# Patient Record
Sex: Male | Born: 1940 | Race: White | Hispanic: No | Marital: Married | Smoking: Never smoker
Health system: Southern US, Community
[De-identification: ages and names within clinical notes are randomized; demographics above are authoritative.]

## PROBLEM LIST (undated history)

## (undated) DIAGNOSIS — T7840XA Allergy, unspecified, initial encounter: Secondary | ICD-10-CM

## (undated) DIAGNOSIS — M1990 Unspecified osteoarthritis, unspecified site: Secondary | ICD-10-CM

## (undated) DIAGNOSIS — IMO0002 Reserved for concepts with insufficient information to code with codable children: Secondary | ICD-10-CM

## (undated) HISTORY — DX: Allergy, unspecified, initial encounter: T78.40XA

## (undated) HISTORY — PX: HERNIA REPAIR: SHX51

## (undated) HISTORY — PX: SHOULDER SURGERY: SHX246

## (undated) HISTORY — PX: JOINT REPLACEMENT: SHX530

## (undated) HISTORY — PX: INGUINAL HERNIA REPAIR: SHX194

## (undated) HISTORY — PX: PROSTATE SURGERY: SHX751

## (undated) HISTORY — DX: Unspecified osteoarthritis, unspecified site: M19.90

## (undated) HISTORY — PX: SQUAMOUS CELL CARCINOMA EXCISION: SHX2433

---

## 2013-08-28 ENCOUNTER — Emergency Department
Admission: EM | Admit: 2013-08-28 | Discharge: 2013-08-28 | Disposition: A | Discharge status: Home / Self Care | Payer: Medicare Other | Source: Home / Self Care | Attending: Family Medicine | Admitting: Family Medicine

## 2013-08-28 ENCOUNTER — Emergency Department (INDEPENDENT_AMBULATORY_CARE_PROVIDER_SITE_OTHER): Payer: Medicare Other

## 2013-08-28 ENCOUNTER — Encounter: Payer: Self-pay | Admitting: Emergency Medicine

## 2013-08-28 DIAGNOSIS — S0190XA Unspecified open wound of unspecified part of head, initial encounter: Secondary | ICD-10-CM

## 2013-08-28 DIAGNOSIS — W219XXA Striking against or struck by unspecified sports equipment, initial encounter: Secondary | ICD-10-CM

## 2013-08-28 DIAGNOSIS — S0100XA Unspecified open wound of scalp, initial encounter: Secondary | ICD-10-CM

## 2013-08-28 DIAGNOSIS — S0101XA Laceration without foreign body of scalp, initial encounter: Secondary | ICD-10-CM

## 2013-08-28 DIAGNOSIS — S0990XA Unspecified injury of head, initial encounter: Secondary | ICD-10-CM

## 2013-08-28 HISTORY — DX: Reserved for concepts with insufficient information to code with codable children: IMO0002

## 2013-08-28 NOTE — ED Provider Notes (Signed)
CSN: [id number]     Arrival date & time 08/28/13  1816 History   First MD Initiated Contact with Patient 08/28/13 1824     Chief Complaint  Patient presents with  . Head Laceration     HPI Comments: [place]  While sitting on his porch this evening, a golf ball suddenly hit the left superior aspect of his head, resulting in a laceration and hematoma.  He had no loss of consciousness, and no localizing neurologic symptoms have developed.  He denies headache.  No nausea/vomiting.  His wife states that his behavior has been normal.  His last Tdap was in 2012.  Patient is a 72 y.o. male presenting with scalp laceration. The history is provided by the patient and the spouse.  Head Laceration This is a new problem. The current episode started less than 1 hour ago. The problem has been gradually improving. Pertinent negatives include no headaches. Associated symptoms comments: No localizing neurologic symptoms.. Treatments tried: pressure dressing.    Past Medical History  Diagnosis Date  . Squamous cell carcinoma    Past Surgical History  Procedure Laterality Date  . Shoulder surgery Right   . Squamous cell carcinoma excision     History reviewed. No pertinent family history. History  Substance Use Topics  . Smoking status: Never Smoker   . Smokeless tobacco: Not on file  . Alcohol Use: Yes    Review of Systems  Constitutional: Negative.   HENT: Negative for ear pain, facial swelling, hearing loss and trouble swallowing.   Eyes: Negative for visual disturbance.  Respiratory: Negative.   Cardiovascular: Negative.   Gastrointestinal: Negative for nausea and vomiting.  Genitourinary: Negative.   Neurological: Negative for dizziness, seizures, syncope, facial asymmetry, speech difficulty, weakness, numbness and headaches.    Allergies  Review of patient's allergies indicates no known allergies.  Home Medications   Current Outpatient Rx  Name  Route   Sig  Dispense  Refill  . bimatoprost (LUMIGAN) 0.03 % ophthalmic solution      1 drop at bedtime.          BP 142/93  Pulse 66  Temp(Src) 97.5 F (36.4 C) (Oral)  Resp 16  SpO2 97% Physical Exam  Nursing note and vitals reviewed. Constitutional: He is oriented to person, place, and time. He appears well-developed and well-nourished. No distress.  HENT:  Head: Normocephalic. Head is with laceration. Head is without Battle's sign.    Right Ear: External ear normal.  Left Ear: External ear normal.  Nose: Nose normal.  Mouth/Throat: Oropharynx is clear and moist.  On the left frontal area, anterior to coronal suture area is a 3cm dia hematoma with a central superficial T-shaped laceration approximately 3cm in total length.  There is minimal tenderness to palpation, and no evidence of depressed skull fracture.  Eyes: Conjunctivae and EOM are normal. Pupils are equal, round, and reactive to light.  Neck: Normal range of motion.  Cardiovascular: Normal heart sounds.   Pulmonary/Chest: Breath sounds normal.  Neurological: He is alert and oriented to person, place, and time. He has normal reflexes. No cranial nerve deficit or sensory deficit. He exhibits normal muscle tone. Coordination normal.  Cerebellar function intact (finger to nose, rapid alternating movement)  Skin: Skin is warm and dry.  Psychiatric: He has a normal mood and affect. Thought content normal.    ED Course  Procedures  Laceration Repair Discussed benefits and risks of procedure and verbal consent obtained.  Using sterile technique, cleansed wound with normal saline.  Wound carefully inspected for debris and foreign bodies; none found.  Wound closed with four staples.  Bacitracin and sterile dressing applied.  Wound precautions explained to patient.  Return for staple removal in 10 days.     Imaging Review Dg Skull Complete  08/28/2013   CLINICAL DATA:  Hit by a golf ball. Laceration  EXAM: SKULL - COMPLETE 4 +  VIEW  COMPARISON:  None.  FINDINGS: There is no evidence of skull fracture or other focal bone lesions.  IMPRESSION: Negative.   Electronically Signed   By: Marlan Palau M.D.   On: 08/28/2013 19:17      MDM   1. Laceration of scalp, initial encounter   2. Head injury, acute, initial encounter; normal neurologic exam     Keep wound clean and dry.  Change bandage daily, and apply antibiotic ointment.  May take Tylenol for pain.  Return for any signs of infection:  Increased drainage, redness, pain, etc.  Discussed head injury precautions.   Apply ice pack every 1 to 2 hours until swelling decreases. Staple removal in 10 days. If symptoms become significantly worse during the night or over the weekend, proceed to the local emergency room.      Lattie Haw, MD 08/28/13 [phone]

## 2013-08-28 NOTE — ED Notes (Signed)
Pt c/o laceration to the LT side of his scalp x 30 mins ago. He reports that he was hit in the head with a golf ball. Denies LOC.

## 2014-08-05 IMAGING — CR DG SKULL COMPLETE 4+V
4 series · 4 of 4 positions shown · non-contrast
Comparison: None.

CLINICAL DATA: Hit by a golf ball. Laceration

EXAM:
SKULL - COMPLETE 4 + VIEW

[view not recorded (1 of 4)]
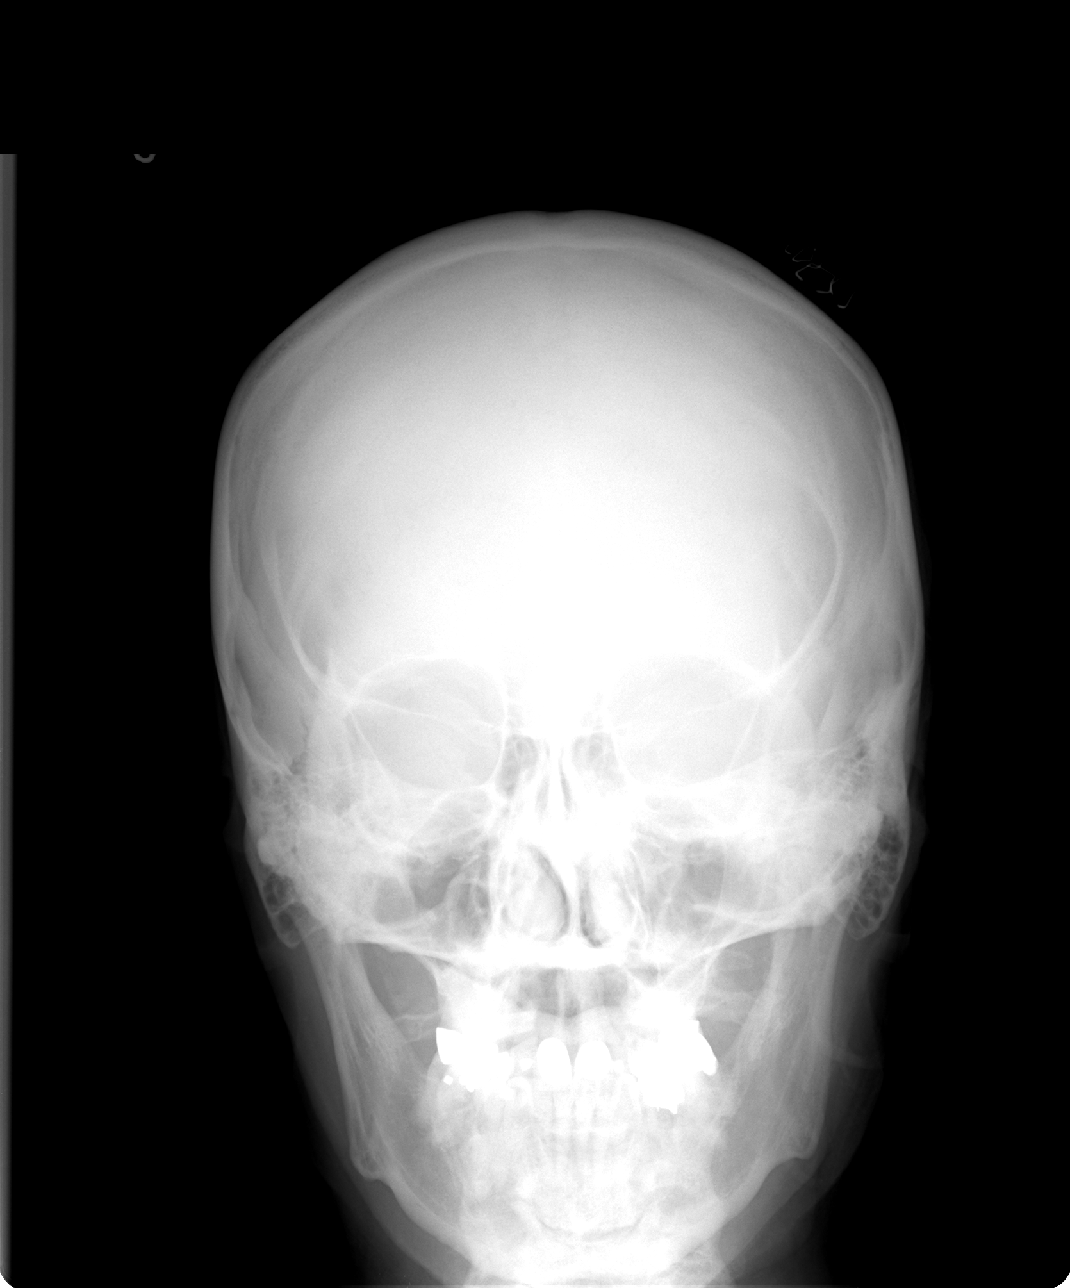

[view not recorded (2 of 4)]
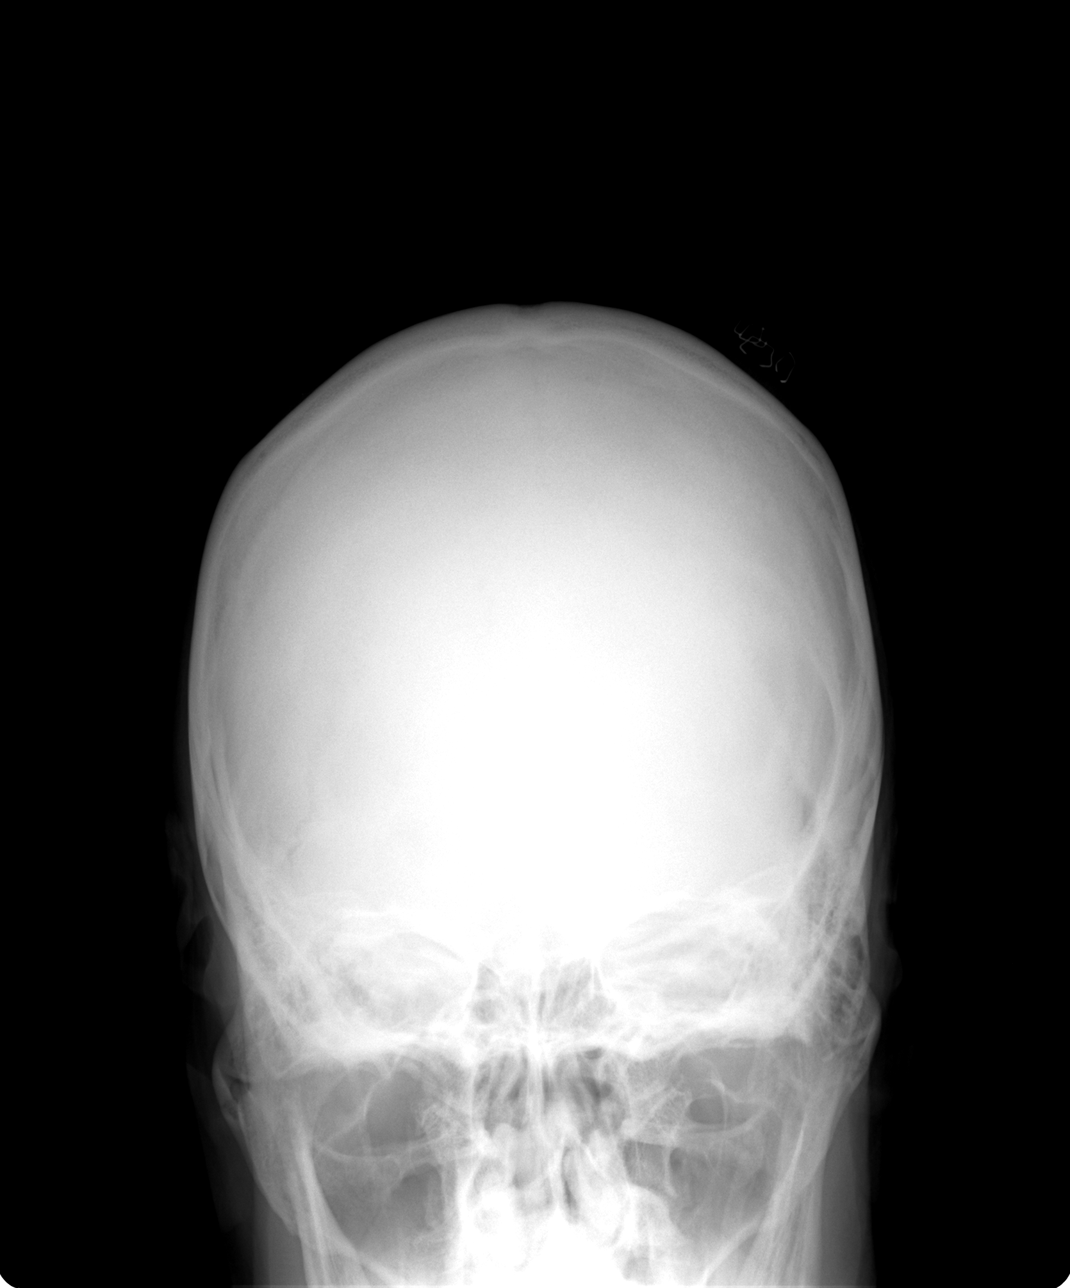

[view not recorded (3 of 4)]
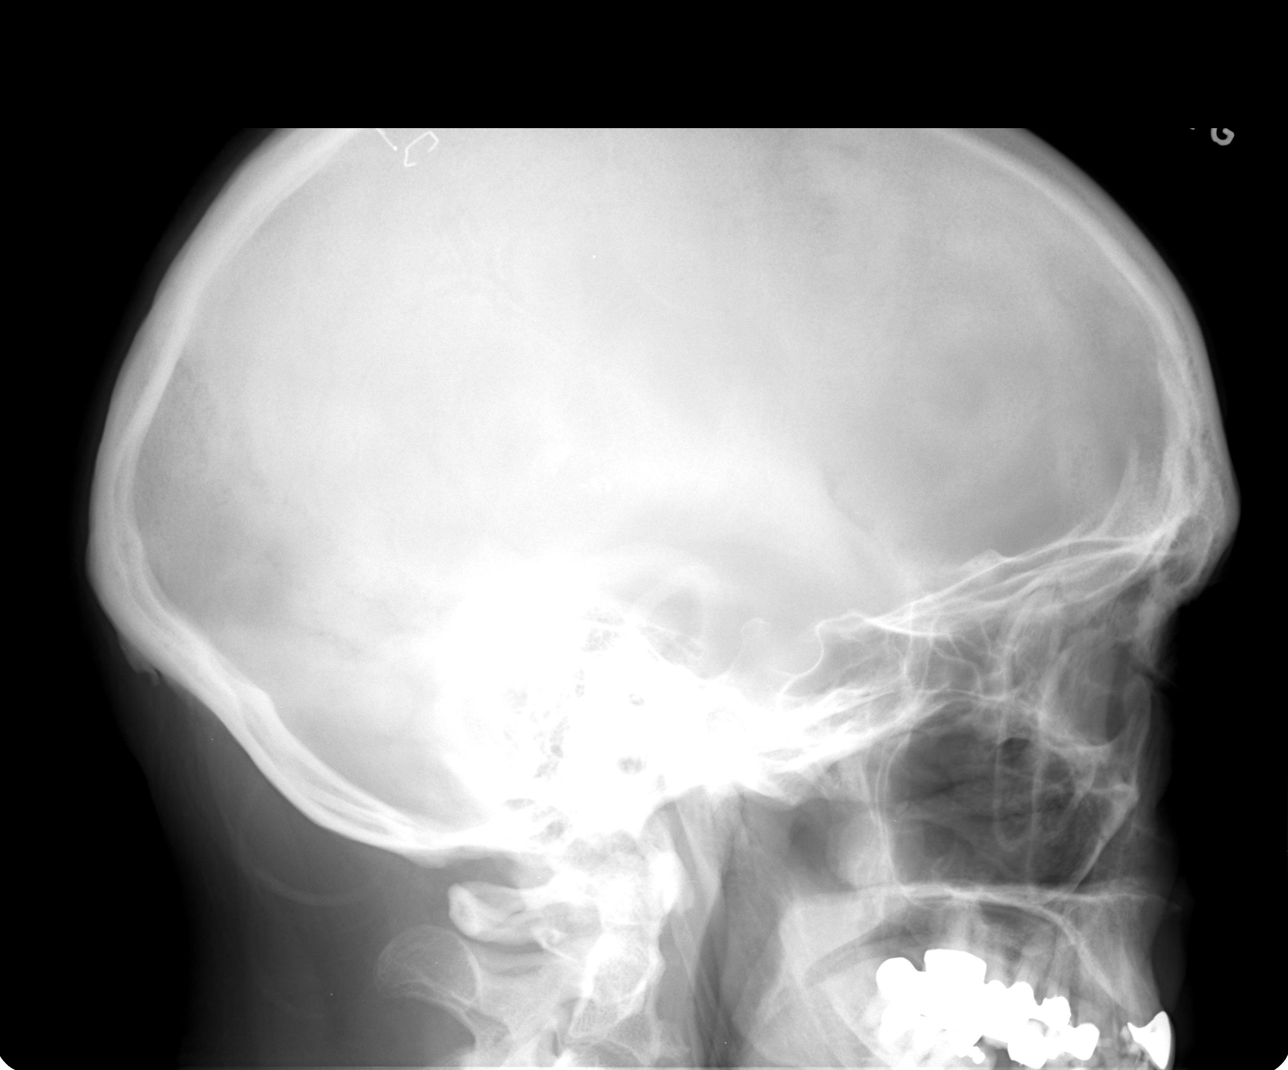

[view not recorded (4 of 4)]
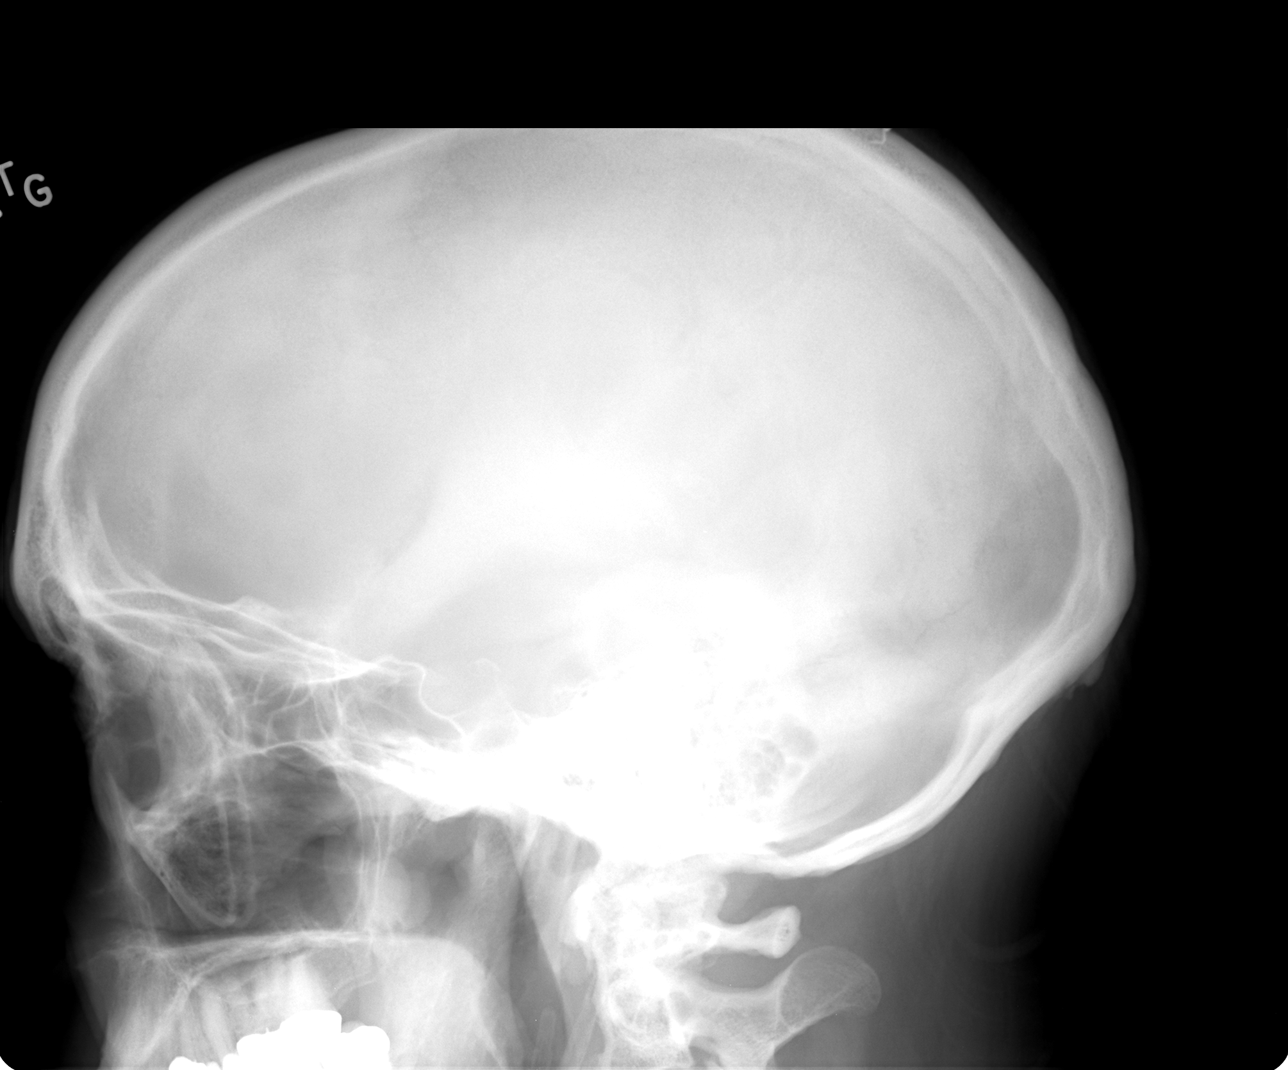

[4 of 4 positions shown; findings below may reference images not displayed]

FINDINGS: There is no evidence of skull fracture or other focal bone lesions.
IMPRESSION: Negative.

## 2022-09-14 DIAGNOSIS — C44319 Basal cell carcinoma of skin of other parts of face: Secondary | ICD-10-CM | POA: Diagnosis not present

## 2022-10-15 ENCOUNTER — Encounter: Payer: Self-pay | Admitting: Medical

## 2022-10-15 ENCOUNTER — Ambulatory Visit (INDEPENDENT_AMBULATORY_CARE_PROVIDER_SITE_OTHER): Payer: Medicare Other | Admitting: Medical

## 2022-10-15 VITALS — BP 124/76 | HR 54 | Temp 98.0°F | Resp 18 | Ht 68.0 in | Wt 154.0 lb

## 2022-10-15 DIAGNOSIS — E785 Hyperlipidemia, unspecified: Secondary | ICD-10-CM | POA: Diagnosis not present

## 2022-10-15 DIAGNOSIS — Z8719 Personal history of other diseases of the digestive system: Secondary | ICD-10-CM

## 2022-10-15 DIAGNOSIS — Z9889 Other specified postprocedural states: Secondary | ICD-10-CM

## 2022-10-15 DIAGNOSIS — R1030 Lower abdominal pain, unspecified: Secondary | ICD-10-CM

## 2022-10-15 LAB — LIPID PANEL
Cholesterol: 187 mg/dL (ref 0–200)
HDL: 49.4 mg/dL (ref >39.00)
LDL Cholesterol: 110 mg/dL — ABNORMAL HIGH (ref 0–99)
NonHDL: 137.56
Total CHOL/HDL Ratio: 4
Triglycerides: 136 mg/dL (ref 0.0–149.0)
VLDL: 27.2 mg/dL (ref 0.0–40.0)

## 2022-10-15 LAB — COMPREHENSIVE METABOLIC PANEL
ALT: 19 U/L (ref 0–53)
AST: 25 U/L (ref 0–37)
Albumin: 4.4 g/dL (ref 3.5–5.2)
Alkaline Phosphatase: 59 U/L (ref 39–117)
BUN: 13 mg/dL (ref 6–23)
CO2: 29 mEq/L (ref 19–32)
Calcium: 9.2 mg/dL (ref 8.4–10.5)
Chloride: 106 mEq/L (ref 96–112)
Creatinine, Ser: 0.86 mg/dL (ref 0.40–1.50)
GFR: 82.12 mL/min (ref >60.00)
Glucose, Bld: 100 mg/dL — ABNORMAL HIGH (ref 70–99)
Potassium: 4.5 mEq/L (ref 3.5–5.1)
Sodium: 142 mEq/L (ref 135–145)
Total Bilirubin: 0.7 mg/dL (ref 0.2–1.2)
Total Protein: 6.9 g/dL (ref 6.0–8.3)

## 2022-10-15 NOTE — Patient Instructions (Addendum)
Nice to meet you.  For you high cholesterol history will get cmp and lipid panel today.  For hx of rt inguinal hernia and surgery I could not feel hernia. Offered surgeon referral but declined. If more pain or bulge let me know and will refer.   Vaccines done at [place]. Though pt states did not get pneumonia, flu or shingles.  Follow up date to be determined after lab review.

## 2022-10-15 NOTE — Progress Notes (Signed)
Subjective:    Patient ID: Larry Fuller, male    DOB: 1941/06/13, 81 y.o.   MRN: [id number]  HPI  Pt in for follow up.  Pt former Cabin crew for 4 years. Pt graduated from [employer] [place] [employer]. Sold optical equipment and other business. Some pharmaceutical studies.  Pt exercises 3-4 days a week. Former did Customer service manager. Eats healthy recently. Non smoker. No alcohol.   [facility] He states just started going.   Prostate cancer hx. He had prostatectomy in 2005 Pt has dermatolgoist and podiatrist already.  Pt has high cholesterol- states has had in past but will come down with strict diet. Pt never been on statin.  Pt has borderline bp level. Pt checks his bp at home and in past his bp was well controlled. This am 124/76  Pt states April 13, 2022. He had inguinial hernia repair. He has minimal pain at times. No bulge. Pt had it done in [place].   Review of Systems  Constitutional:  Negative for chills, fatigue and fever.  HENT:  Negative for dental problem and ear discharge.   Respiratory:  Negative for cough, chest tightness, shortness of breath and wheezing.   Cardiovascular:  Negative for chest pain and palpitations.  Gastrointestinal:  Negative for abdominal pain and anal bleeding.  Genitourinary:  Negative for dysuria, flank pain, frequency and hematuria.  Musculoskeletal:  Negative for back pain, joint swelling, myalgias and neck pain.  Neurological:  Negative for dizziness, speech difficulty, weakness, light-headedness and headaches.  Hematological:  Negative for adenopathy. Does not bruise/bleed easily.  Psychiatric/Behavioral:  Negative for behavioral problems and confusion.      Past Medical History:  Diagnosis Date   Squamous cell carcinoma      Social History   Socioeconomic History   Marital status: Married    Spouse name: Not on file   Number of children: Not on file   Years of education: Not on file   Highest education level: Not on  file  Occupational History   Not on file  Tobacco Use   Smoking status: Never   Smokeless tobacco: Not on file  Substance and Sexual Activity   Alcohol use: Yes   Drug use: No   Sexual activity: Not on file  Other Topics Concern   Not on file  Social History Narrative   Not on file   Social Determinants of Health   Financial Resource Strain: Not on file  Food Insecurity: Not on file  Transportation Needs: Not on file  Physical Activity: Not on file  Stress: Not on file  Social Connections: Not on file  Intimate Partner Violence: Not on file    Past Surgical History:  Procedure Laterality Date   PROSTATE SURGERY     SHOULDER SURGERY Right    SQUAMOUS CELL CARCINOMA EXCISION      No family history on file.  Allergies  Allergen Reactions   Oxycodone Rash   Opium     Current Outpatient Medications on File Prior to Visit  Medication Sig Dispense Refill   latanoprost (XALATAN) 0.005 % ophthalmic solution 1 drop at bedtime.     No current facility-administered medications on file prior to visit.    BP 124/76   Pulse (!) 54   Temp 98 F (36.7 C)   Resp 18   Ht 5\' 8"  (1.727 m)   Wt 154 lb (69.9 kg)   SpO2 95%   BMI 23.42 kg/m  Objective:   Physical Exam  General Mental Status- Alert. General Appearance- Not in acute distress.   Skin General: Color- Normal Color. Moisture- Normal Moisture.  Neck No JVD.  Chest and Lung Exam Auscultation: Breath Sounds:-Normal.  Cardiovascular Auscultation:Rythm- Regular. Murmurs & Other Heart Sounds:Auscultation of the heart reveals- No Murmurs.  Abdomen Inspection:-Inspeection Normal. Palpation/Percussion:Note:No mass. Palpation and Percussion of the abdomen reveal- Non Tender, Non Distended + BS, no rebound or guarding.   Neurologic Cranial Nerve exam:- CN III-XII intact(No nystagmus), symmetric smile. Strength:- 5/5 equal and symmetric strength both upper and lower extremities.   Genital  exam- on exam no obvious hernia felt either side. Particularly.     Assessment & Plan:   Patient Instructions  Nice to meet you.  For you high cholesterol history will get cmp and lipid panel today.  For hx of rt inguinal hernia and surgery I could not feel hernia. Offered surgeon referral but declined. If more pain or bulge let me know and will refer.   Vaccines done at [place]. Though pt states did not get pneumonia, flu or shingles.  Follow up date to be determined after lab review.     Esperanza Richters, PA-C

## 2022-11-10 ENCOUNTER — Encounter: Payer: Self-pay | Admitting: Medical

## 2022-11-10 NOTE — Addendum Note (Signed)
Addended by: Anabel Halon on: 11/10/2022 05:02 PM   Modules accepted: Orders

## 2022-11-29 DIAGNOSIS — R1031 Right lower quadrant pain: Secondary | ICD-10-CM | POA: Diagnosis not present

## 2022-11-30 ENCOUNTER — Other Ambulatory Visit: Payer: Self-pay | Admitting: General Surgery

## 2022-11-30 DIAGNOSIS — R1031 Right lower quadrant pain: Secondary | ICD-10-CM

## 2022-12-27 ENCOUNTER — Ambulatory Visit
Admission: RE | Admit: 2022-12-27 | Discharge: 2022-12-27 | Disposition: A | Discharge status: Home / Self Care | Payer: Medicare Other | Source: Ambulatory Visit | Attending: General Surgery | Admitting: General Surgery

## 2022-12-27 DIAGNOSIS — R1031 Right lower quadrant pain: Secondary | ICD-10-CM

## 2022-12-27 DIAGNOSIS — M47816 Spondylosis without myelopathy or radiculopathy, lumbar region: Secondary | ICD-10-CM | POA: Diagnosis not present

## 2022-12-27 DIAGNOSIS — I700 Atherosclerosis of aorta: Secondary | ICD-10-CM | POA: Diagnosis not present

## 2022-12-27 DIAGNOSIS — K5730 Diverticulosis of large intestine without perforation or abscess without bleeding: Secondary | ICD-10-CM | POA: Diagnosis not present

## 2022-12-27 DIAGNOSIS — M47817 Spondylosis without myelopathy or radiculopathy, lumbosacral region: Secondary | ICD-10-CM | POA: Diagnosis not present

## 2022-12-27 MED ORDER — IOPAMIDOL (ISOVUE-300) INJECTION 61%
100.0000 mL | Freq: Once | INTRAVENOUS | Status: AC | PRN
  Administered 2022-12-27: 100 mL via INTRAVENOUS

## 2023-01-18 DIAGNOSIS — H524 Presbyopia: Secondary | ICD-10-CM | POA: Diagnosis not present

## 2023-02-14 ENCOUNTER — Telehealth: Payer: Self-pay | Admitting: Medical

## 2023-02-14 NOTE — Telephone Encounter (Signed)
Copied from CRM [id number]. Topic: Medicare AWV >> Feb 14, 2023  9:45 AM Payton Doughty wrote: Reason for CRM: Called patient to schedule Medicare Annual Wellness Visit (AWV). Left message for patient to call back and schedule Medicare Annual Wellness Visit (AWV).  Last date of AWV: 04/15/2019   Please schedule an appointment at any time with Kennedy Bucker, LPN  .  If any questions, please contact me.  Thank you ,  Verlee Rossetti; Care Guide Ambulatory Clinical Support  l [facility] Direct Dial: [phone]

## 2023-02-14 NOTE — Telephone Encounter (Signed)
Contacted Larry Fuller to schedule their annual wellness visit. Appointment made for 02/21/2023.  Verlee Rossetti; Care Guide Ambulatory Clinical Support [place] [facility] Direct Dial: [phone]

## 2023-02-15 DIAGNOSIS — D225 Melanocytic nevi of trunk: Secondary | ICD-10-CM | POA: Diagnosis not present

## 2023-02-15 DIAGNOSIS — L821 Other seborrheic keratosis: Secondary | ICD-10-CM | POA: Diagnosis not present

## 2023-02-15 DIAGNOSIS — L570 Actinic keratosis: Secondary | ICD-10-CM | POA: Diagnosis not present

## 2023-02-15 DIAGNOSIS — Z85828 Personal history of other malignant neoplasm of skin: Secondary | ICD-10-CM | POA: Diagnosis not present

## 2023-02-21 ENCOUNTER — Ambulatory Visit (INDEPENDENT_AMBULATORY_CARE_PROVIDER_SITE_OTHER): Payer: Medicare Other | Admitting: *Deleted

## 2023-02-21 DIAGNOSIS — Z0000 Encounter for general adult medical examination without abnormal findings: Secondary | ICD-10-CM

## 2023-02-21 NOTE — Patient Instructions (Signed)
Larry Fuller , Thank you for taking time to come for your Medicare Wellness Visit. I appreciate your ongoing commitment to your health goals. Please review the following plan we discussed and let me know if I can assist you in the future.   These are the goals we discussed:  Goals   None     This is a list of the screening recommended for you and due dates:  Health Maintenance  Topic Date Due   COVID-19 Vaccine (1) Never done   Zoster (Shingles) Vaccine (1 of 2) Never done   Pneumonia Vaccine (2 of 2 - PCV) 01/08/2012   Flu Shot  06/09/2023   Medicare Annual Wellness Visit  02/21/2024   DTaP/Tdap/Td vaccine (3 - Td or Tdap) 09/21/2032   HPV Vaccine  Aged Out     Next appointment: Follow up in one year for your annual wellness visit.   Preventive Care 47 Years and Older, Male Preventive care refers to lifestyle choices and visits with your health care provider that can promote health and wellness. What does preventive care include? A yearly physical exam. This is also called an annual well check. Dental exams once or twice a year. Routine eye exams. Ask your health care provider how often you should have your eyes checked. Personal lifestyle choices, including: Daily care of your teeth and gums. Regular physical activity. Eating a healthy diet. Avoiding tobacco and drug use. Limiting alcohol use. Practicing safe sex. Taking low doses of aspirin every day. Taking vitamin and mineral supplements as recommended by your health care provider. What happens during an annual well check? The services and screenings done by your health care provider during your annual well check will depend on your age, overall health, lifestyle risk factors, and family history of disease. Counseling  Your health care provider may ask you questions about your: Alcohol use. Tobacco use. Drug use. Emotional well-being. Home and relationship well-being. Sexual activity. Eating habits. History of  falls. Memory and ability to understand (cognition). Work and work Astronomer. Screening  You may have the following tests or measurements: Height, weight, and BMI. Blood pressure. Lipid and cholesterol levels. These may be checked every 5 years, or more frequently if you are over 31 years old. Skin check. Lung cancer screening. You may have this screening every year starting at age 38 if you have a 30-pack-year history of smoking and currently smoke or have quit within the past 15 years. Fecal occult blood test (FOBT) of the stool. You may have this test every year starting at age 39. Flexible sigmoidoscopy or colonoscopy. You may have a sigmoidoscopy every 5 years or a colonoscopy every 10 years starting at age 53. Prostate cancer screening. Recommendations will vary depending on your family history and other risks. Hepatitis C blood test. Hepatitis B blood test. Sexually transmitted disease (STD) testing. Diabetes screening. This is done by checking your blood sugar (glucose) after you have not eaten for a while (fasting). You may have this done every 1-3 years. Abdominal aortic aneurysm (AAA) screening. You may need this if you are a current or former smoker. Osteoporosis. You may be screened starting at age 16 if you are at high risk. Talk with your health care provider about your test results, treatment options, and if necessary, the need for more tests. Vaccines  Your health care provider may recommend certain vaccines, such as: Influenza vaccine. This is recommended every year. Tetanus, diphtheria, and acellular pertussis (Tdap, Td) vaccine. You may need a  Td booster every 10 years. Zoster vaccine. You may need this after age 1. Pneumococcal 13-valent conjugate (PCV13) vaccine. One dose is recommended after age 60. Pneumococcal polysaccharide (PPSV23) vaccine. One dose is recommended after age 32. Talk to your health care provider about which screenings and vaccines you need and  how often you need them. This information is not intended to replace advice given to you by your health care provider. Make sure you discuss any questions you have with your health care provider. Document Released: 11/21/2015 Document Revised: 07/14/2016 Document Reviewed: 08/26/2015 Elsevier Interactive Patient Education  2017 [employer].  Fall Prevention in the Home Falls can cause injuries. They can happen to people of all ages. There are many things you can do to make your home safe and to help prevent falls. What can I do on the outside of my home? Regularly fix the edges of walkways and driveways and fix any cracks. Remove anything that might make you trip as you walk through a door, such as a raised step or threshold. Trim any bushes or trees on the path to your home. Use bright outdoor lighting. Clear any walking paths of anything that might make someone trip, such as rocks or tools. Regularly check to see if handrails are loose or broken. Make sure that both sides of any steps have handrails. Any raised decks and porches should have guardrails on the edges. Have any leaves, snow, or ice cleared regularly. Use sand or salt on walking paths during winter. Clean up any spills in your garage right away. This includes oil or grease spills. What can I do in the bathroom? Use night lights. Install grab bars by the toilet and in the tub and shower. Do not use towel bars as grab bars. Use non-skid mats or decals in the tub or shower. If you need to sit down in the shower, use a plastic, non-slip stool. Keep the floor dry. Clean up any water that spills on the floor as soon as it happens. Remove soap buildup in the tub or shower regularly. Attach bath mats securely with double-sided non-slip rug tape. Do not have throw rugs and other things on the floor that can make you trip. What can I do in the bedroom? Use night lights. Make sure that you have a light by your bed that is easy to  reach. Do not use any sheets or blankets that are too big for your bed. They should not hang down onto the floor. Have a firm chair that has side arms. You can use this for support while you get dressed. Do not have throw rugs and other things on the floor that can make you trip. What can I do in the kitchen? Clean up any spills right away. Avoid walking on wet floors. Keep items that you use a lot in easy-to-reach places. If you need to reach something above you, use a strong step stool that has a grab bar. Keep electrical cords out of the way. Do not use floor polish or wax that makes floors slippery. If you must use wax, use non-skid floor wax. Do not have throw rugs and other things on the floor that can make you trip. What can I do with my stairs? Do not leave any items on the stairs. Make sure that there are handrails on both sides of the stairs and use them. Fix handrails that are broken or loose. Make sure that handrails are as long as the stairways. Check any  carpeting to make sure that it is firmly attached to the stairs. Fix any carpet that is loose or worn. Avoid having throw rugs at the top or bottom of the stairs. If you do have throw rugs, attach them to the floor with carpet tape. Make sure that you have a light switch at the top of the stairs and the bottom of the stairs. If you do not have them, ask someone to add them for you. What else can I do to help prevent falls? Wear shoes that: Do not have high heels. Have rubber bottoms. Are comfortable and fit you well. Are closed at the toe. Do not wear sandals. If you use a stepladder: Make sure that it is fully opened. Do not climb a closed stepladder. Make sure that both sides of the stepladder are locked into place. Ask someone to hold it for you, if possible. Clearly mark and make sure that you can see: Any grab bars or handrails. First and last steps. Where the edge of each step is. Use tools that help you move  around (mobility aids) if they are needed. These include: Canes. Walkers. Scooters. Crutches. Turn on the lights when you go into a dark area. Replace any light bulbs as soon as they burn out. Set up your furniture so you have a clear path. Avoid moving your furniture around. If any of your floors are uneven, fix them. If there are any pets around you, be aware of where they are. Review your medicines with your doctor. Some medicines can make you feel dizzy. This can increase your chance of falling. Ask your doctor what other things that you can do to help prevent falls. This information is not intended to replace advice given to you by your health care provider. Make sure you discuss any questions you have with your health care provider. Document Released: 08/21/2009 Document Revised: 04/01/2016 Document Reviewed: 11/29/2014 Elsevier Interactive Patient Education  2017 [employer].

## 2023-02-21 NOTE — Progress Notes (Signed)
Subjective:  Pt completed ADLs, Fall risk, and SDOH during e-check in on 4/141/24.  Answers verified with pt.    Larry Fuller is a 82 y.o. male who presents for Medicare Annual/Subsequent preventive examination.  I connected with  Kae Heller on 02/21/23 by a audio enabled telemedicine application and verified that I am speaking with the correct person using two identifiers.  Patient Location: Home  Provider Location: Office/Clinic  I discussed the limitations of evaluation and management by telemedicine. The patient expressed understanding and agreed to proceed.   Review of Systems     Cardiac Risk Factors include: advanced age (>46men, >68 women);male gender;dyslipidemia     Objective:    There were no vitals filed for this visit. There is no height or weight on file to calculate BMI.     02/21/2023    2:27 PM  Advanced Directives  Does Patient Have a Medical Advance Directive? Yes  Type of Advance Directive Living will    Current Medications (verified) Outpatient Encounter Medications as of 02/21/2023  Medication Sig   latanoprost (XALATAN) 0.005 % ophthalmic solution 1 drop at bedtime.   No facility-administered encounter medications on file as of 02/21/2023.    Allergies (verified) Oxycodone and Opium   History: Past Medical History:  Diagnosis Date   Allergy 1956   took shots   Arthritis 2000   just a little not serious   Squamous cell carcinoma    Past Surgical History:  Procedure Laterality Date   HERNIA REPAIR  2023   mild fixed   JOINT REPLACEMENT  2017/2019   rt knee,rt shoulder   PROSTATE SURGERY     SHOULDER SURGERY Right    SQUAMOUS CELL CARCINOMA EXCISION     History reviewed. No pertinent family history. Social History   Socioeconomic History   Marital status: Married    Spouse name: Not on file   Number of children: Not on file   Years of education: Not on file   Highest education level: Not on file  Occupational  History   Not on file  Tobacco Use   Smoking status: Never   Smokeless tobacco: Not on file  Substance and Sexual Activity   Alcohol use: Yes   Drug use: No   Sexual activity: Not on file  Other Topics Concern   Not on file  Social History Narrative   Not on file   Social Determinants of Health   Financial Resource Strain: Low Risk  (02/20/2023)   Overall Financial Resource Strain (CARDIA)    Difficulty of Paying Living Expenses: Not hard at all  Food Insecurity: No Food Insecurity (02/20/2023)   Hunger Vital Sign    Worried About Running Out of Food in the Last Year: Never true    Ran Out of Food in the Last Year: Never true  Transportation Needs: No Transportation Needs (02/20/2023)   PRAPARE - Administrator, Civil Service (Medical): No    Lack of Transportation (Non-Medical): No  Physical Activity: Insufficiently Active (02/20/2023)   Exercise Vital Sign    Days of Exercise per Week: 4 days    Minutes of Exercise per Session: 30 min  Stress: Not on file  Social Connections: Unknown (02/20/2023)   Social Connection and Isolation Panel [NHANES]    Frequency of Communication with Friends and Family: More than three times a week    Frequency of Social Gatherings with Friends and Family: Three times a week    Attends  Religious Services: Not on file    Active Member of Clubs or Organizations: Yes    Attends Club or Organization Meetings: More than 4 times per year    Marital Status: Married    Tobacco Counseling Counseling given: Not Answered   Clinical Intake:  Pre-visit preparation completed: Yes  Pain : No/denies pain  Nutritional Risks: None Diabetes: No  How often do you need to have someone help you when you read instructions, pamphlets, or other written materials from your doctor or pharmacy?: 2 - Rarely   Activities of Daily Living    02/20/2023    6:39 PM  In your present state of health, do you have any difficulty performing the following  activities:  Hearing? 0  Vision? 0  Difficulty concentrating or making decisions? 0  Walking or climbing stairs? 0  Dressing or bathing? 0  Doing errands, shopping? 0  Preparing Food and eating ? N  Using the Toilet? N  In the past six months, have you accidently leaked urine? N  Do you have problems with loss of bowel control? N  Managing your Medications? N  Managing your Finances? N  Housekeeping or managing your Housekeeping? N    Patient Care Team: Saguier, Kateri Mc as PCP - General (Internal Medicine)  Indicate any recent Medical Services you may have received from other than [facility] providers in the past year (date may be approximate).     Assessment:   This is a routine wellness examination for [facility].  Hearing/Vision screen No results found.  Dietary issues and exercise activities discussed: Current Exercise Habits: Home exercise routine, Type of exercise: calisthenics;strength training/weights;stretching;walking, Time (Minutes): 40, Frequency (Times/Week): 5, Weekly Exercise (Minutes/Week): 200, Intensity: Moderate, Exercise limited by: None identified   Goals Addressed   None    Depression Screen    02/21/2023    2:28 PM 10/15/2022   10:14 AM  PHQ 2/9 Scores  PHQ - 2 Score 0 0    Fall Risk    02/20/2023    6:39 PM 10/15/2022   10:14 AM  Fall Risk   Falls in the past year? 0 0  Number falls in past yr: 0 0  Injury with Fall? 0 0  Risk for fall due to : No Fall Risks No Fall Risks  Follow up Falls evaluation completed Falls evaluation completed    FALL RISK PREVENTION PERTAINING TO THE HOME:  Any stairs in or around the home? Yes  If so, are there any without handrails? No  Home free of loose throw rugs in walkways, pet beds, electrical cords, etc? Yes  Adequate lighting in your home to reduce risk of falls? Yes   ASSISTIVE DEVICES UTILIZED TO PREVENT FALLS:  Life alert? No  Use of a cane, walker or w/c? No  Grab bars in the bathroom? No   Shower chair or bench in shower?  Built in bench Elevated toilet seat or a handicapped toilet? No   TIMED UP AND GO:  Was the test performed?  No, audio visit .    Cognitive Function:        02/21/2023    2:33 PM  6CIT Screen  What Year? 0 points  What month? 0 points  What time? 0 points  Count back from 20 0 points  Months in reverse 2 points  Repeat phrase 0 points  Total Score 2 points    Immunizations Immunization History  Administered Date(s) Administered   Pneumococcal Polysaccharide-23 01/08/2011   Td (Adult),5  Lf Tetanus Toxid, Preservative Free 09/11/2007   Tdap 12/02/2011, 09/21/2022    TDAP status: Up to date  Flu Vaccine status: Up to date  Pneumococcal vaccine status: Due, Education has been provided regarding the importance of this vaccine. Advised may receive this vaccine at local pharmacy or Health Dept. Aware to provide a copy of the vaccination record if obtained from local pharmacy or Health Dept. Verbalized acceptance and understanding.  Covid-19 vaccine status: Information provided on how to obtain vaccines.   Qualifies for Shingles Vaccine? Yes   Zostavax completed No   Shingrix Completed?: No.    Education has been provided regarding the importance of this vaccine. Patient has been advised to call insurance company to determine out of pocket expense if they have not yet received this vaccine. Advised may also receive vaccine at local pharmacy or Health Dept. Verbalized acceptance and understanding.  Screening Tests Health Maintenance  Topic Date Due   COVID-19 Vaccine (1) Never done   Zoster Vaccines- Shingrix (1 of 2) Never done   Pneumonia Vaccine 25+ Years old (2 of 2 - PCV) 01/08/2012   Medicare Annual Wellness (AWV)  04/15/2019   INFLUENZA VACCINE  06/09/2023   DTaP/Tdap/Td (3 - Td or Tdap) 09/21/2032   HPV VACCINES  Aged Out    Health Maintenance  Health Maintenance Due  Topic Date Due   COVID-19 Vaccine (1) Never done    Zoster Vaccines- Shingrix (1 of 2) Never done   Pneumonia Vaccine 25+ Years old (2 of 2 - PCV) 01/08/2012   Medicare Annual Wellness (AWV)  04/15/2019    Colorectal cancer screening: No longer required.   Lung Cancer Screening: (Low Dose CT Chest recommended if Age 49-80 years, 30 pack-year currently smoking OR have quit w/in 15years.) does not qualify.   Additional Screening:  Hepatitis C Screening: does not qualify  Vision Screening: Recommended annual ophthalmology exams for early detection of glaucoma and other disorders of the eye. Is the patient up to date with their annual eye exam?  Yes  Who is the provider or what is the name of the office in which the patient attends annual eye exams? Dr. Clearance Coots If pt is not established with a provider, would they like to be referred to a provider to establish care? No .   Dental Screening: Recommended annual dental exams for proper oral hygiene  Community Resource Referral / Chronic Care Management: CRR required this visit?  No   CCM required this visit?  No      Plan:     I have personally reviewed and noted the following in the patient's chart:   Medical and social history Use of alcohol, tobacco or illicit drugs  Current medications and supplements including opioid prescriptions. Patient is not currently taking opioid prescriptions. Functional ability and status Nutritional status Physical activity Advanced directives List of other physicians Hospitalizations, surgeries, and ER visits in previous 12 months Vitals Screenings to include cognitive, depression, and falls Referrals and appointments  In addition, I have reviewed and discussed with patient certain preventive protocols, quality metrics, and best practice recommendations. A written personalized care plan for preventive services as well as general preventive health recommendations were provided to patient.   Due to this being a telephonic visit, the after visit  summary with patients personalized plan was offered to patient via mail or my-chart.  Patient would like to access on my-chart.  Donne Anon, [place]   02/21/2023   Nurse Notes: None

## 2023-03-17 DIAGNOSIS — G5762 Lesion of plantar nerve, left lower limb: Secondary | ICD-10-CM | POA: Diagnosis not present

## 2023-03-17 DIAGNOSIS — M79672 Pain in left foot: Secondary | ICD-10-CM | POA: Diagnosis not present

## 2023-03-24 DIAGNOSIS — G5762 Lesion of plantar nerve, left lower limb: Secondary | ICD-10-CM | POA: Diagnosis not present

## 2023-03-24 DIAGNOSIS — G5760 Lesion of plantar nerve, unspecified lower limb: Secondary | ICD-10-CM | POA: Diagnosis not present

## 2023-03-24 DIAGNOSIS — G8918 Other acute postprocedural pain: Secondary | ICD-10-CM | POA: Diagnosis not present

## 2023-03-30 DIAGNOSIS — M79672 Pain in left foot: Secondary | ICD-10-CM | POA: Diagnosis not present

## 2023-03-30 DIAGNOSIS — G5762 Lesion of plantar nerve, left lower limb: Secondary | ICD-10-CM | POA: Diagnosis not present

## 2023-04-06 DIAGNOSIS — G5762 Lesion of plantar nerve, left lower limb: Secondary | ICD-10-CM | POA: Diagnosis not present

## 2023-04-06 DIAGNOSIS — M79672 Pain in left foot: Secondary | ICD-10-CM | POA: Diagnosis not present

## 2023-05-04 DIAGNOSIS — M79672 Pain in left foot: Secondary | ICD-10-CM | POA: Diagnosis not present

## 2023-05-04 DIAGNOSIS — G5762 Lesion of plantar nerve, left lower limb: Secondary | ICD-10-CM | POA: Diagnosis not present

## 2023-06-21 DIAGNOSIS — Z85828 Personal history of other malignant neoplasm of skin: Secondary | ICD-10-CM | POA: Diagnosis not present

## 2023-06-21 DIAGNOSIS — Z129 Encounter for screening for malignant neoplasm, site unspecified: Secondary | ICD-10-CM | POA: Diagnosis not present

## 2023-06-21 DIAGNOSIS — L570 Actinic keratosis: Secondary | ICD-10-CM | POA: Diagnosis not present

## 2023-06-30 DIAGNOSIS — Z961 Presence of intraocular lens: Secondary | ICD-10-CM | POA: Diagnosis not present

## 2023-06-30 DIAGNOSIS — H40013 Open angle with borderline findings, low risk, bilateral: Secondary | ICD-10-CM | POA: Diagnosis not present

## 2023-06-30 DIAGNOSIS — H35361 Drusen (degenerative) of macula, right eye: Secondary | ICD-10-CM | POA: Diagnosis not present

## 2023-09-28 ENCOUNTER — Ambulatory Visit (INDEPENDENT_AMBULATORY_CARE_PROVIDER_SITE_OTHER): Payer: Medicare Other | Admitting: Medical

## 2023-09-28 ENCOUNTER — Encounter: Payer: Self-pay | Admitting: Medical

## 2023-09-28 VITALS — BP 130/80 | HR 55 | Temp 98.0°F | Resp 16 | Ht 68.0 in | Wt 157.2 lb

## 2023-09-28 DIAGNOSIS — R739 Hyperglycemia, unspecified: Secondary | ICD-10-CM

## 2023-09-28 DIAGNOSIS — H6123 Impacted cerumen, bilateral: Secondary | ICD-10-CM | POA: Diagnosis not present

## 2023-09-28 DIAGNOSIS — D649 Anemia, unspecified: Secondary | ICD-10-CM | POA: Diagnosis not present

## 2023-09-28 DIAGNOSIS — E785 Hyperlipidemia, unspecified: Secondary | ICD-10-CM

## 2023-09-28 DIAGNOSIS — E875 Hyperkalemia: Secondary | ICD-10-CM

## 2023-09-28 NOTE — Patient Instructions (Addendum)
1. Hyperlipidemia, unspecified hyperlipidemia type -follow labs and update you when results in. Low cholesterol diet. - Comp Met (CMET); Future - Lipid panel; Future  2. Elevated blood sugar Low sugar diet and continue exercise. - Hemoglobin A1c; Future  3. Anemia, unspecified type Mild anemia in past. Will repeat lab - CBC w/Diff; Future  4. Bilateral impacted cerumen -verbal authorization given. MA lavaged. -lavage failed/stopped due to  tranisent dizzines. Post check wax present and tm normal. -can try debrox over the counter.(Pt state he will try to remove himself) -if ever want ENTreferral to remove wax just send me my chart message.  Follow up date to be determined after lab. If reasonable/normal labs then recommend follow up in 6 months or sooner if needed

## 2023-09-28 NOTE — Progress Notes (Signed)
Subjective:    Patient ID: Larry Fuller, male    DOB: [birth year], 82 y.o.   MRN: [id number]  HPI Pt in for yearly check up. Pt still doing some mild exercise push ups and weights. Walks 3 days a week.    [facility] Got labs done. No abnormality.   Pt has high cholesterol. His level have been mild high in the past.   [facility]. Hx of prostate cancer surgery.  On review prior cbc showed mild abnormality mild anemia.   Pt declines flu vaccine. Declines vaccines in general. Discussed time frame of treatment for illness if were to occur.  Bp bp had home 116-125 systollc/70-80 diastolic  Pt feels mild plugged sensation to ears. But no pain.   Pt routinely follows up with dermatologist.   Review of Systems  Constitutional:  Negative for chills, fatigue and fever.  HENT:  Negative for congestion, ear discharge and ear pain.   Respiratory:  Negative for cough, chest tightness, shortness of breath and wheezing.   Cardiovascular:  Negative for chest pain and palpitations.  Gastrointestinal:  Negative for abdominal pain, nausea and vomiting.  Musculoskeletal:  Negative for back pain, joint swelling and neck stiffness.  Skin:  Negative for rash.  Neurological:  Negative for dizziness, speech difficulty and light-headedness.  Hematological:  Negative for adenopathy. Does not bruise/bleed easily.  Psychiatric/Behavioral:  Negative for behavioral problems and decreased concentration.     Past Medical History:  Diagnosis Date   Allergy 1956   took shots   Arthritis 2000   just a little not serious   Squamous cell carcinoma      Social History   Socioeconomic History   Marital status: Married    Spouse name: Not on file   Number of children: Not on file   Years of education: Not on file   Highest education level: Not on file  Occupational History   Not on file  Tobacco Use   Smoking status: Never   Smokeless tobacco: Not  on file  Substance and Sexual Activity   Alcohol use: Yes   Drug use: No   Sexual activity: Not on file  Other Topics Concern   Not on file  Social History Narrative   Not on file   Social Determinants of Health   Financial Resource Strain: Low Risk  (02/20/2023)   Overall Financial Resource Strain (CARDIA)    Difficulty of Paying Living Expenses: Not hard at all  Food Insecurity: No Food Insecurity (02/20/2023)   Hunger Vital Sign    Worried About Running Out of Food in the Last Year: Never true    Ran Out of Food in the Last Year: Never true  Transportation Needs: No Transportation Needs (02/20/2023)   PRAPARE - Administrator, Civil Service (Medical): No    Lack of Transportation (Non-Medical): No  Physical Activity: Insufficiently Active (02/20/2023)   Exercise Vital Sign    Days of Exercise per Week: 4 days    Minutes of Exercise per Session: 30 min  Stress: Not on file  Social Connections: Unknown (02/20/2023)   Social Connection and Isolation Panel [NHANES]    Frequency of Communication with Friends and Family: More than three times a week    Frequency of Social Gatherings with Friends and Family: Three times a week    Attends Religious Services: Not on file    Active Member of Clubs  or Organizations: Yes    Attends Banker Meetings: More than 4 times per year    Marital Status: Married  Catering manager Violence: Not At Risk (02/21/2023)   Humiliation, Afraid, Rape, and Kick questionnaire    Fear of Current or Ex-Partner: No    Emotionally Abused: No    Physically Abused: No    Sexually Abused: No    Past Surgical History:  Procedure Laterality Date   HERNIA REPAIR  2023   mild fixed   JOINT REPLACEMENT  2017/2019   rt knee,rt shoulder   PROSTATE SURGERY     SHOULDER SURGERY Right    SQUAMOUS CELL CARCINOMA EXCISION      No family history on file.  Allergies  Allergen Reactions   Oxycodone Rash   Opium     Current Outpatient  Medications on File Prior to Visit  Medication Sig Dispense Refill   latanoprost (XALATAN) 0.005 % ophthalmic solution 1 drop at bedtime.     No current facility-administered medications on file prior to visit.    BP 130/80   Pulse (!) 55   Temp 98 F (36.7 C) (Oral)   Resp 16   Ht 5\' 8"  (1.727 m)   Wt 157 lb 3.2 oz (71.3 kg)   SpO2 100%   BMI 23.90 kg/m          Objective:   Physical Exam  General Mental Status- Alert. General Appearance- Not in acute distress.   Skin General: Color- Normal Color. Moisture- Normal Moisture.  Neck Carotid Arteries- Normal color. Moisture- Normal Moisture. No carotid bruits. No JVD.  Chest and Lung Exam Auscultation: Breath Sounds:-Normal.  Cardiovascular Auscultation:Rythm- Regular. Murmurs & Other Heart Sounds:Auscultation of the heart reveals- No Murmurs.  Abdomen Inspection:-Inspeection Normal. Palpation/Percussion:Note:No mass. Palpation and Percussion of the abdomen reveal- Non Tender, Non Distended + BS, no rebound or guarding.   Neurologic Cranial Nerve exam:- CN III-XII intact(No nystagmus), symmetric smile. Strength:- 5/5 equal and symmetric strength both upper and lower extremities.    Heent- rt ear canal appears mild to moderate amount of wax. Left side-moderate to severe amount     Assessment & Plan:   Patient Instructions  1. Hyperlipidemia, unspecified hyperlipidemia type -follow labs and update you when results in. Low cholesterol diet. - Comp Met (CMET); Future - Lipid panel; Future  2. Elevated blood sugar Low sugar diet and continue exercise. - Hemoglobin A1c; Future  3. Anemia, unspecified type Mild anemia in past. Will repeat lab - CBC w/Diff; Future  4. Bilateral impacted cerumen -verbal authorization given. MA lavaged. -lavage failed/stopped due to  tranisent dizzines. Post check wax present and tm normal. -can try debrox over the counter.(Pt state he will try to remove himself) -if ever  want ENTreferral to remove wax just send me my chart message.  Follow up date to be determined after lab. If reasonable/normal labs then recommend follow up in 6 months or sooner if needed    [employer], PA-C

## 2023-09-29 ENCOUNTER — Other Ambulatory Visit (INDEPENDENT_AMBULATORY_CARE_PROVIDER_SITE_OTHER): Payer: Medicare Other

## 2023-09-29 DIAGNOSIS — D649 Anemia, unspecified: Secondary | ICD-10-CM

## 2023-09-29 DIAGNOSIS — R739 Hyperglycemia, unspecified: Secondary | ICD-10-CM | POA: Diagnosis not present

## 2023-09-29 DIAGNOSIS — E785 Hyperlipidemia, unspecified: Secondary | ICD-10-CM | POA: Diagnosis not present

## 2023-09-29 LAB — COMPREHENSIVE METABOLIC PANEL
ALT: 16 U/L (ref 0–53)
AST: 20 U/L (ref 0–37)
Albumin: 4.3 g/dL (ref 3.5–5.2)
Alkaline Phosphatase: 59 U/L (ref 39–117)
BUN: 16 mg/dL (ref 6–23)
CO2: 30 meq/L (ref 19–32)
Calcium: 9 mg/dL (ref 8.4–10.5)
Chloride: 103 meq/L (ref 96–112)
Creatinine, Ser: 0.89 mg/dL (ref 0.40–1.50)
GFR: 80.73 mL/min (ref >60.00)
Glucose, Bld: 103 mg/dL — ABNORMAL HIGH (ref 70–99)
Potassium: 4.7 meq/L (ref 3.5–5.1)
Sodium: 138 meq/L (ref 135–145)
Total Bilirubin: 0.8 mg/dL (ref 0.2–1.2)
Total Protein: 6.8 g/dL (ref 6.0–8.3)

## 2023-09-29 LAB — LIPID PANEL
Cholesterol: 200 mg/dL (ref 0–200)
HDL: 53.5 mg/dL (ref >39.00)
LDL Cholesterol: 121 mg/dL — ABNORMAL HIGH (ref 0–99)
NonHDL: 146.6
Total CHOL/HDL Ratio: 4
Triglycerides: 129 mg/dL (ref 0.0–149.0)
VLDL: 25.8 mg/dL (ref 0.0–40.0)

## 2023-09-29 LAB — CBC WITH DIFFERENTIAL/PLATELET
Basophils Absolute: 0 10*3/uL (ref 0.0–0.1)
Basophils Relative: 1 % (ref 0.0–3.0)
Eosinophils Absolute: 0.3 10*3/uL (ref 0.0–0.7)
Eosinophils Relative: 5.7 % — ABNORMAL HIGH (ref 0.0–5.0)
HCT: 44.9 % (ref 39.0–52.0)
Hemoglobin: 14.9 g/dL (ref 13.0–17.0)
Lymphocytes Relative: 35.3 % (ref 12.0–46.0)
Lymphs Abs: 1.7 10*3/uL (ref 0.7–4.0)
MCHC: 33.3 g/dL (ref 30.0–36.0)
MCV: 93.8 fL (ref 78.0–100.0)
Monocytes Absolute: 0.4 10*3/uL (ref 0.1–1.0)
Monocytes Relative: 7.5 % (ref 3.0–12.0)
Neutro Abs: 2.5 10*3/uL (ref 1.4–7.7)
Neutrophils Relative %: 50.5 % (ref 43.0–77.0)
Platelets: 232 10*3/uL (ref 150.0–400.0)
RBC: 4.79 Mil/uL (ref 4.22–5.81)
RDW: 13 % (ref 11.5–15.5)
WBC: 4.9 10*3/uL (ref 4.0–10.5)

## 2023-09-29 LAB — HEMOGLOBIN A1C: Hgb A1c MFr Bld: 6 % (ref 4.6–6.5)

## 2023-10-04 ENCOUNTER — Encounter: Payer: Self-pay | Admitting: Medical

## 2023-12-06 NOTE — Addendum Note (Signed)
Addended by: Gwenevere Abbot on: 12/06/2023 05:06 PM   Modules accepted: Orders

## 2023-12-20 DIAGNOSIS — H524 Presbyopia: Secondary | ICD-10-CM | POA: Diagnosis not present

## 2023-12-22 DIAGNOSIS — L570 Actinic keratosis: Secondary | ICD-10-CM | POA: Diagnosis not present

## 2023-12-22 DIAGNOSIS — Z85828 Personal history of other malignant neoplasm of skin: Secondary | ICD-10-CM | POA: Diagnosis not present

## 2023-12-22 DIAGNOSIS — L821 Other seborrheic keratosis: Secondary | ICD-10-CM | POA: Diagnosis not present

## 2023-12-22 DIAGNOSIS — Z129 Encounter for screening for malignant neoplasm, site unspecified: Secondary | ICD-10-CM | POA: Diagnosis not present

## 2023-12-22 DIAGNOSIS — D485 Neoplasm of uncertain behavior of skin: Secondary | ICD-10-CM | POA: Diagnosis not present

## 2023-12-22 DIAGNOSIS — D225 Melanocytic nevi of trunk: Secondary | ICD-10-CM | POA: Diagnosis not present

## 2023-12-22 DIAGNOSIS — D0461 Carcinoma in situ of skin of right upper limb, including shoulder: Secondary | ICD-10-CM | POA: Diagnosis not present

## 2024-01-03 ENCOUNTER — Other Ambulatory Visit (INDEPENDENT_AMBULATORY_CARE_PROVIDER_SITE_OTHER): Payer: Medicare Other

## 2024-01-03 ENCOUNTER — Encounter: Payer: Self-pay | Admitting: Medical

## 2024-01-03 DIAGNOSIS — R739 Hyperglycemia, unspecified: Secondary | ICD-10-CM

## 2024-01-03 DIAGNOSIS — E785 Hyperlipidemia, unspecified: Secondary | ICD-10-CM | POA: Diagnosis not present

## 2024-01-03 LAB — COMPREHENSIVE METABOLIC PANEL
ALT: 15 U/L (ref 0–53)
AST: 20 U/L (ref 0–37)
Albumin: 4.2 g/dL (ref 3.5–5.2)
Alkaline Phosphatase: 65 U/L (ref 39–117)
BUN: 14 mg/dL (ref 6–23)
CO2: 28 meq/L (ref 19–32)
Calcium: 8.7 mg/dL (ref 8.4–10.5)
Chloride: 107 meq/L (ref 96–112)
Creatinine, Ser: 0.88 mg/dL (ref 0.40–1.50)
GFR: 80.85 mL/min (ref >60.00)
Glucose, Bld: 106 mg/dL — ABNORMAL HIGH (ref 70–99)
Potassium: 5.2 meq/L — ABNORMAL HIGH (ref 3.5–5.1)
Sodium: 142 meq/L (ref 135–145)
Total Bilirubin: 0.5 mg/dL (ref 0.2–1.2)
Total Protein: 6.9 g/dL (ref 6.0–8.3)

## 2024-01-03 LAB — LIPID PANEL
Cholesterol: 180 mg/dL (ref 0–200)
HDL: 50.6 mg/dL (ref >39.00)
LDL Cholesterol: 110 mg/dL — ABNORMAL HIGH (ref 0–99)
NonHDL: 129.18
Total CHOL/HDL Ratio: 4
Triglycerides: 95 mg/dL (ref 0.0–149.0)
VLDL: 19 mg/dL (ref 0.0–40.0)

## 2024-01-03 LAB — HEMOGLOBIN A1C: Hgb A1c MFr Bld: 6 % (ref 4.6–6.5)

## 2024-01-03 NOTE — Addendum Note (Signed)
 Addended by: Gwenevere Abbot on: 01/03/2024 05:30 PM   Modules accepted: Orders

## 2024-01-12 ENCOUNTER — Telehealth: Payer: Self-pay | Admitting: Medical

## 2024-01-12 NOTE — Telephone Encounter (Signed)
 Copied from CRM [id number]. Topic: Medicare AWV >> Jan 12, 2024  2:45 PM Payton Doughty wrote: Reason for CRM: Called LVM 01/12/2024 to schedule AWV. Please schedule Virtual or Telehealth visits ONLY.   Verlee Rossetti; Care Guide Ambulatory Clinical Support [place] [facility] Direct Dial: [phone]

## 2024-01-16 ENCOUNTER — Other Ambulatory Visit (INDEPENDENT_AMBULATORY_CARE_PROVIDER_SITE_OTHER): Payer: Medicare Other

## 2024-01-16 DIAGNOSIS — E875 Hyperkalemia: Secondary | ICD-10-CM | POA: Diagnosis not present

## 2024-01-16 LAB — COMPREHENSIVE METABOLIC PANEL
ALT: 16 U/L (ref 0–53)
AST: 19 U/L (ref 0–37)
Albumin: 4.2 g/dL (ref 3.5–5.2)
Alkaline Phosphatase: 54 U/L (ref 39–117)
BUN: 16 mg/dL (ref 6–23)
CO2: 27 meq/L (ref 19–32)
Calcium: 8.8 mg/dL (ref 8.4–10.5)
Chloride: 105 meq/L (ref 96–112)
Creatinine, Ser: 0.89 mg/dL (ref 0.40–1.50)
GFR: 80.56 mL/min (ref >60.00)
Glucose, Bld: 97 mg/dL (ref 70–99)
Potassium: 4.2 meq/L (ref 3.5–5.1)
Sodium: 141 meq/L (ref 135–145)
Total Bilirubin: 0.7 mg/dL (ref 0.2–1.2)
Total Protein: 6.6 g/dL (ref 6.0–8.3)

## 2024-01-17 ENCOUNTER — Encounter: Payer: Self-pay | Admitting: Medical

## 2024-02-28 DIAGNOSIS — D0461 Carcinoma in situ of skin of right upper limb, including shoulder: Secondary | ICD-10-CM | POA: Diagnosis not present

## 2024-03-13 ENCOUNTER — Ambulatory Visit

## 2024-03-13 ENCOUNTER — Encounter: Payer: Self-pay | Admitting: Medical

## 2024-05-22 ENCOUNTER — Ambulatory Visit (INDEPENDENT_AMBULATORY_CARE_PROVIDER_SITE_OTHER): Admitting: Medical

## 2024-05-22 VITALS — BP 124/60 | HR 48 | Temp 98.0°F | Resp 18 | Ht 68.0 in | Wt 154.0 lb

## 2024-05-22 DIAGNOSIS — E785 Hyperlipidemia, unspecified: Secondary | ICD-10-CM

## 2024-05-22 DIAGNOSIS — R42 Dizziness and giddiness: Secondary | ICD-10-CM | POA: Diagnosis not present

## 2024-05-22 DIAGNOSIS — R739 Hyperglycemia, unspecified: Secondary | ICD-10-CM | POA: Diagnosis not present

## 2024-05-22 LAB — COMPLETE METABOLIC PANEL WITHOUT GFR
AG Ratio: 1.6 (calc) (ref 1.0–2.5)
ALT: 17 U/L (ref 9–46)
AST: 20 U/L (ref 10–35)
Albumin: 4.4 g/dL (ref 3.6–5.1)
Alkaline phosphatase (APISO): 67 U/L (ref 35–144)
BUN: 16 mg/dL (ref 7–25)
CO2: 31 mmol/L (ref 20–32)
Calcium: 9.2 mg/dL (ref 8.6–10.3)
Chloride: 104 mmol/L (ref 98–110)
Creat: 0.79 mg/dL (ref 0.70–1.22)
Globulin: 2.8 g/dL (ref 1.9–3.7)
Glucose, Bld: 115 mg/dL — ABNORMAL HIGH (ref 65–99)
Potassium: 5.2 mmol/L (ref 3.5–5.3)
Sodium: 139 mmol/L (ref 135–146)
Total Bilirubin: 0.8 mg/dL (ref 0.2–1.2)
Total Protein: 7.2 g/dL (ref 6.1–8.1)

## 2024-05-22 LAB — LIPID PANEL
Cholesterol: 202 mg/dL — ABNORMAL HIGH (ref <200)
HDL: 61 mg/dL (ref >=40)
LDL Cholesterol (Calc): 119 mg/dL — ABNORMAL HIGH
Non-HDL Cholesterol (Calc): 141 mg/dL — ABNORMAL HIGH (ref <130)
Total CHOL/HDL Ratio: 3.3 (calc) (ref <5.0)
Triglycerides: 109 mg/dL (ref <150)

## 2024-05-22 LAB — CBC WITH DIFFERENTIAL/PLATELET
Basophils Absolute: 0 K/uL (ref 0.0–0.1)
Basophils Relative: 1 % (ref 0.0–3.0)
Eosinophils Absolute: 0.2 K/uL (ref 0.0–0.7)
Eosinophils Relative: 4.5 % (ref 0.0–5.0)
HCT: 45 % (ref 39.0–52.0)
Hemoglobin: 14.9 g/dL (ref 13.0–17.0)
Lymphocytes Relative: 29.9 % (ref 12.0–46.0)
Lymphs Abs: 1.2 K/uL (ref 0.7–4.0)
MCHC: 33.1 g/dL (ref 30.0–36.0)
MCV: 93.2 fl (ref 78.0–100.0)
Monocytes Absolute: 0.3 K/uL (ref 0.1–1.0)
Monocytes Relative: 8.4 % (ref 3.0–12.0)
Neutro Abs: 2.2 K/uL (ref 1.4–7.7)
Neutrophils Relative %: 56.2 % (ref 43.0–77.0)
Platelets: 226 K/uL (ref 150.0–400.0)
RBC: 4.83 Mil/uL (ref 4.22–5.81)
RDW: 13.9 % (ref 11.5–15.5)
WBC: 3.9 K/uL — ABNORMAL LOW (ref 4.0–10.5)

## 2024-05-22 LAB — HEMOGLOBIN A1C: Hgb A1c MFr Bld: 6 % (ref 4.6–6.5)

## 2024-05-22 NOTE — Patient Instructions (Addendum)
 Vertigo vs dizziness(more like dizziness one week ago) Intermittent dizziness episodes. None for a week. -can try Dramamine.(counseled on how to use) - Consider referral to physical therapy for Epley maneuvers if vertigo/spinning sensation occurs - Consider CT scan if dizziness occurs with sensory or motor symptoms. -Also if any future event were to occur check blood pressure and pulse.  Elevated Blood Glucose Blood glucose at 106 mg/dL requires monitoring. - Order metabolic panel and hemoglobin J8r.  Hyperlipidemia Mild hyperlipidemia with dietary modifications underway. - Order lipid panel.  Cerumen Impaction Moderate bilateral ear wax with adequate hearing. - Consider referral to ENT if hearing becomes impaired.  General Health Maintenance Declined Shingrix vaccine. Educated on shingles and flu vaccine timing. - Discuss flu vaccine timing in September or October.  Follow-up Care alternates between [place] and current provider/myself every 6 months. Sooner if needed. - Schedule follow-up appointment in six months.
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
## 2024-05-24 ENCOUNTER — Ambulatory Visit: Payer: Self-pay | Admitting: Medical

## 2024-06-27 DIAGNOSIS — Z85828 Personal history of other malignant neoplasm of skin: Secondary | ICD-10-CM | POA: Diagnosis not present

## 2024-06-27 DIAGNOSIS — Z86008 Personal history of in-situ neoplasm of other site: Secondary | ICD-10-CM | POA: Diagnosis not present

## 2024-06-27 DIAGNOSIS — C44519 Basal cell carcinoma of skin of other part of trunk: Secondary | ICD-10-CM | POA: Diagnosis not present

## 2024-06-27 DIAGNOSIS — L570 Actinic keratosis: Secondary | ICD-10-CM | POA: Diagnosis not present

## 2024-06-27 DIAGNOSIS — L821 Other seborrheic keratosis: Secondary | ICD-10-CM | POA: Diagnosis not present

## 2024-06-27 DIAGNOSIS — D485 Neoplasm of uncertain behavior of skin: Secondary | ICD-10-CM | POA: Diagnosis not present

## 2024-06-27 DIAGNOSIS — D225 Melanocytic nevi of trunk: Secondary | ICD-10-CM | POA: Diagnosis not present

## 2024-07-11 DIAGNOSIS — H04123 Dry eye syndrome of bilateral lacrimal glands: Secondary | ICD-10-CM | POA: Diagnosis not present

## 2024-07-11 DIAGNOSIS — Z961 Presence of intraocular lens: Secondary | ICD-10-CM | POA: Diagnosis not present

## 2024-07-11 DIAGNOSIS — H40013 Open angle with borderline findings, low risk, bilateral: Secondary | ICD-10-CM | POA: Diagnosis not present

## 2024-07-11 DIAGNOSIS — H35361 Drusen (degenerative) of macula, right eye: Secondary | ICD-10-CM | POA: Diagnosis not present

## 2024-07-17 ENCOUNTER — Ambulatory Visit (INDEPENDENT_AMBULATORY_CARE_PROVIDER_SITE_OTHER)

## 2024-07-17 VITALS — Ht 68.0 in | Wt 154.0 lb

## 2024-07-17 DIAGNOSIS — Z0000 Encounter for general adult medical examination without abnormal findings: Secondary | ICD-10-CM | POA: Diagnosis not present

## 2024-07-17 NOTE — Patient Instructions (Addendum)
 Larry Fuller,  Thank you for taking the time for your Medicare Wellness Visit. I appreciate your continued commitment to your health goals. Please review the care plan we discussed, and feel free to reach out if I can assist you further.  Medicare recommends these wellness visits once per year to help you and your care team stay ahead of potential health issues. These visits are designed to focus on prevention, allowing your provider to concentrate on managing your acute and chronic conditions during your regular appointments.  Please note that Annual Wellness Visits do not include a physical exam. Some assessments may be limited, especially if the visit was conducted virtually. If needed, we may recommend a separate in-person follow-up with your provider.  Ongoing Care Seeing your primary care provider every 3 to 6 months helps us  monitor your health and provide consistent, personalized care.   Referrals If a referral was made during today's visit and you haven't received any updates within two weeks, please contact the referred provider directly to check on the status.  Recommended Screenings:  Health Maintenance  Topic Date Due   COVID-19 Vaccine (1) Never done   Zoster (Shingles) Vaccine (1 of 2) Never done   Pneumococcal Vaccine for age over 68 (2 of 2 - PCV) 09/27/2024*   Flu Shot  02/05/2025*   Medicare Annual Wellness Visit  07/17/2025   DTaP/Tdap/Td vaccine (3 - Td or Tdap) 09/21/2032   HPV Vaccine  Aged Out   Meningitis B Vaccine  Aged Out  *Topic was postponed. The date shown is not the original due date.       07/17/2024    2:31 PM  Advanced Directives  Does Patient Have a Medical Advance Directive? No  Would patient like information on creating a medical advance directive? Yes (MAU/Ambulatory/Procedural Areas - Information given)   Advance Care Planning is important because it: Ensures you receive medical care that aligns with your values, goals, and  preferences. Provides guidance to your family and loved ones, reducing the emotional burden of decision-making during critical moments.  Information on Advanced Care Planning can be found at [place]  Secretary of [facility] Advance Health Care Directives Advance Health Care Directives ([email or web address]   Vision: Annual vision screenings are recommended for early detection of glaucoma, cataracts, and diabetic retinopathy. These exams can also reveal signs of chronic conditions such as diabetes and high blood pressure.  Dental: Annual dental screenings help detect early signs of oral cancer, gum disease, and other conditions linked to overall health, including heart disease and diabetes.  Please see the attached documents for additional preventive care recommendations.

## 2024-07-17 NOTE — Progress Notes (Signed)
 Subjective:   Larry Fuller is a 83 y.o. who presents for a Medicare Wellness preventive visit.  As a reminder, Annual Wellness Visits don't include a physical exam, and some assessments may be limited, especially if this visit is performed virtually. We may recommend an in-person follow-up visit with your provider if needed.  Visit Complete: Virtual I connected with  Larry Fuller on 07/17/24 by a audio enabled telemedicine application and verified that I am speaking with the correct person using two identifiers.  Patient Location: Home  Provider Location: Home Office  I discussed the limitations of evaluation and management by telemedicine. The patient expressed understanding and agreed to proceed.  Vital Signs: Because this visit was a virtual/telehealth visit, some criteria may be missing or patient reported. Any vitals not documented were not able to be obtained and vitals that have been documented are patient reported.  VideoDeclined- This patient declined Librarian, academic. Therefore the visit was completed with audio only.  Persons Participating in Visit: Patient.  AWV Questionnaire: No: Patient Medicare AWV questionnaire was not completed prior to this visit.  Cardiac Risk Factors include: advanced age (>57men, >37 women);male gender;dyslipidemia     Objective:    Today's Vitals   07/17/24 1426  Weight: 154 lb (69.9 kg)  Height: 5' 8 (1.727 m)   Body mass index is 23.42 kg/m.     07/17/2024    2:31 PM 02/21/2023    2:27 PM  Advanced Directives  Does Patient Have a Medical Advance Directive? No Yes  Type of Advance Directive  Living will  Would patient like information on creating a medical advance directive? Yes (MAU/Ambulatory/Procedural Areas - Information given)     Current Medications (verified) Outpatient Encounter Medications as of 07/17/2024  Medication Sig   latanoprost (XALATAN) 0.005 % ophthalmic solution 1  drop at bedtime.   No facility-administered encounter medications on file as of 07/17/2024.    Allergies (verified) Oxycodone and Opium   History: Past Medical History:  Diagnosis Date   Allergy 1956   took shots   Arthritis 2000   just a little not serious   Squamous cell carcinoma    Past Surgical History:  Procedure Laterality Date   INGUINAL HERNIA REPAIR  2023   mild fixed   JOINT REPLACEMENT  2017/2019   rt knee,rt shoulder   PROSTATE SURGERY     SHOULDER SURGERY Right    SQUAMOUS CELL CARCINOMA EXCISION     No family history on file. Social History   Socioeconomic History   Marital status: Married    Spouse name: Not on file   Number of children: Not on file   Years of education: Not on file   Highest education level: Not on file  Occupational History   Not on file  Tobacco Use   Smoking status: Never   Smokeless tobacco: Not on file  Substance and Sexual Activity   Alcohol use: Yes   Drug use: No   Sexual activity: Not on file  Other Topics Concern   Not on file  Social History Narrative   Not on file   Social Drivers of Health   Financial Resource Strain: Low Risk  (07/17/2024)   Overall Financial Resource Strain (CARDIA)    Difficulty of Paying Living Expenses: Not hard at all  Food Insecurity: No Food Insecurity (07/17/2024)   Hunger Vital Sign    Worried About Running Out of Food in the Last Year: Never true  Ran Out of Food in the Last Year: Never true  Transportation Needs: No Transportation Needs (07/17/2024)   PRAPARE - Administrator, Civil Service (Medical): No    Lack of Transportation (Non-Medical): No  Physical Activity: Sufficiently Active (07/17/2024)   Exercise Vital Sign    Days of Exercise per Week: 5 days    Minutes of Exercise per Session: 60 min  Stress: No Stress Concern Present (07/17/2024)   [employer] of Occupational Health - Occupational Stress Questionnaire    Feeling of Stress: Not at all  Social  Connections: Socially Integrated (07/17/2024)   Social Connection and Isolation Panel    Frequency of Communication with Friends and Family: More than three times a week    Frequency of Social Gatherings with Friends and Family: Twice a week    Attends Religious Services: 1 to 4 times per year    Active Member of [employer] or Organizations: Yes    Attends Engineer, structural: More than 4 times per year    Marital Status: Married    Tobacco Counseling Counseling given: Not Answered    Clinical Intake:  Pre-visit preparation completed: Yes  Pain : No/denies pain  Diabetes: No  Lab Results  Component Value Date   HGBA1C 6.0 05/22/2024   HGBA1C 6.0 01/03/2024   HGBA1C 6.0 09/29/2023     How often do you need to have someone help you when you read instructions, pamphlets, or other written materials from your doctor or pharmacy?: 1 - Never  Interpreter Needed?: No  Information entered by :: Charmaine Bloodgood LPN   Activities of Daily Living     07/17/2024    2:28 PM  In your present state of health, do you have any difficulty performing the following activities:  Hearing? 0  Vision? 0  Difficulty concentrating or making decisions? 0  Walking or climbing stairs? 0  Dressing or bathing? 0  Doing errands, shopping? 0  Preparing Food and eating ? N  Using the Toilet? N  In the past six months, have you accidently leaked urine? N  Do you have problems with loss of bowel control? N  Managing your Medications? N  Managing your Finances? N  Housekeeping or managing your Housekeeping? N    Patient Care Team: Saguier, Edward, PA-C as PCP - General (Internal Medicine) Clinic, Bonni Refugia Laurence Montie, MD as Referring Physician (Dermatology) Rudy Greig GAILS, OD (Optometry)  I have updated your Care Teams any recent Medical Services you may have received from other providers in the past year.     Assessment:   This is a routine wellness examination for  [facility]  Hearing/Vision screen Hearing Screening - Comments:: Denies hearing difficulties   Vision Screening - Comments:: Wears rx glasses - up to date with routine eye exams with Dr. Rudy    Goals Addressed             This Visit's Progress    Remain active and independent   On track      Depression Screen     07/17/2024    2:29 PM 09/28/2023    1:52 PM 02/21/2023    2:28 PM 10/15/2022   10:14 AM  PHQ 2/9 Scores  PHQ - 2 Score 0 0 0 0    Fall Risk     07/17/2024    2:31 PM 09/28/2023    1:51 PM 02/20/2023    6:39 PM 10/15/2022   10:14 AM  Fall Risk  Falls in the past year? 0 0 0 0  Number falls in past yr: 0 0 0 0  Injury with Fall? 0 0 0 0  Risk for fall due to : No Fall Risks No Fall Risks No Fall Risks No Fall Risks  Follow up Falls prevention discussed;Education provided;Falls evaluation completed Falls evaluation completed Falls evaluation completed Falls evaluation completed      Data saved with a previous flowsheet row definition    MEDICARE RISK AT HOME:  Medicare Risk at Home Any stairs in or around the home?: No If so, are there any without handrails?: No Home free of loose throw rugs in walkways, pet beds, electrical cords, etc?: Yes Adequate lighting in your home to reduce risk of falls?: Yes Life alert?: No Use of a cane, walker or w/c?: No Grab bars in the bathroom?: Yes Shower chair or bench in shower?: No Elevated toilet seat or a handicapped toilet?: No  TIMED UP AND GO:  Was the test performed?  No  Cognitive Function: 6CIT completed        07/17/2024    2:31 PM 02/21/2023    2:33 PM  6CIT Screen  What Year? 0 points 0 points  What month? 0 points 0 points  What time? 0 points 0 points  Count back from 20 0 points 0 points  Months in reverse 0 points 2 points  Repeat phrase 0 points 0 points  Total Score 0 points 2 points    Immunizations Immunization History  Administered Date(s) Administered   Pneumococcal Polysaccharide-23  01/08/2011   Td (Adult),5 Lf Tetanus Toxid, Preservative Free 09/11/2007   Tdap 12/02/2011, 09/21/2022    Screening Tests Health Maintenance  Topic Date Due   COVID-19 Vaccine (1) Never done   Zoster Vaccines- Shingrix (1 of 2) Never done   Pneumococcal Vaccine: 50+ Years (2 of 2 - PCV) 09/27/2024 (Originally 01/08/2012)   Influenza Vaccine  02/05/2025 (Originally 06/08/2024)   Medicare Annual Wellness (AWV)  07/17/2025   DTaP/Tdap/Td (3 - Td or Tdap) 09/21/2032   HPV VACCINES  Aged Out   Meningococcal B Vaccine  Aged Out    Health Maintenance Items Addressed: Patient declines vaccines   Additional Screening:  Vision Screening: Recommended annual ophthalmology exams for early detection of glaucoma and other disorders of the eye. Is the patient up to date with their annual eye exam?  Yes  Who is the provider or what is the name of the office in which the patient attends annual eye exams? Dr. Rudy   Dental Screening: Recommended annual dental exams for proper oral hygiene  Community Resource Referral / Chronic Care Management: CRR required this visit?  No   CCM required this visit?  No   Plan:    I have personally reviewed and noted the following in the patient's chart:   Medical and social history Use of alcohol, tobacco or illicit drugs  Current medications and supplements including opioid prescriptions. Patient is not currently taking opioid prescriptions. Functional ability and status Nutritional status Physical activity Advanced directives List of other physicians Hospitalizations, surgeries, and ER visits in previous 12 months Vitals Screenings to include cognitive, depression, and falls Referrals and appointments  In addition, I have reviewed and discussed with patient certain preventive protocols, quality metrics, and best practice recommendations. A written personalized care plan for preventive services as well as general preventive health recommendations  were provided to patient.   Lavelle Pfeiffer [place], [place]   [id number]   After Visit  Summary: (MyChart) Due to this being a telephonic visit, the after visit summary with patients personalized plan was offered to patient via MyChart   Notes: PCP Follow Up Recommendations: Patient is having some discomfort in the area of past hernia repair.  Would like to discuss further at upcoming visit.

## 2024-07-18 DIAGNOSIS — C44519 Basal cell carcinoma of skin of other part of trunk: Secondary | ICD-10-CM | POA: Diagnosis not present

## 2024-08-09 ENCOUNTER — Encounter: Payer: Self-pay | Admitting: Medical

## 2024-08-30 DIAGNOSIS — M76892 Other specified enthesopathies of left lower limb, excluding foot: Secondary | ICD-10-CM | POA: Diagnosis not present

## 2024-08-30 DIAGNOSIS — M25562 Pain in left knee: Secondary | ICD-10-CM | POA: Diagnosis not present

## 2024-08-30 DIAGNOSIS — G8929 Other chronic pain: Secondary | ICD-10-CM | POA: Diagnosis not present

## 2024-08-30 DIAGNOSIS — M1712 Unilateral primary osteoarthritis, left knee: Secondary | ICD-10-CM | POA: Diagnosis not present

## 2024-09-11 ENCOUNTER — Ambulatory Visit: Admitting: Medical

## 2024-09-21 ENCOUNTER — Encounter: Payer: Self-pay | Admitting: Medical

## 2024-09-27 ENCOUNTER — Emergency Department (HOSPITAL_BASED_OUTPATIENT_CLINIC_OR_DEPARTMENT_OTHER)
Admission: EM | Admit: 2024-09-27 | Discharge: 2024-09-27 | Disposition: A | Discharge status: Home / Self Care | Source: Ambulatory Visit | Attending: Emergency Medicine | Admitting: Emergency Medicine

## 2024-09-27 ENCOUNTER — Other Ambulatory Visit: Payer: Self-pay

## 2024-09-27 ENCOUNTER — Emergency Department (HOSPITAL_BASED_OUTPATIENT_CLINIC_OR_DEPARTMENT_OTHER)

## 2024-09-27 ENCOUNTER — Encounter (HOSPITAL_BASED_OUTPATIENT_CLINIC_OR_DEPARTMENT_OTHER): Payer: Self-pay | Admitting: Urology

## 2024-09-27 ENCOUNTER — Ambulatory Visit
Admission: EM | Admit: 2024-09-27 | Discharge: 2024-09-27 | Disposition: A | Discharge status: Other Acute Inpatient Hospital

## 2024-09-27 DIAGNOSIS — M1712 Unilateral primary osteoarthritis, left knee: Secondary | ICD-10-CM | POA: Diagnosis not present

## 2024-09-27 DIAGNOSIS — S61210A Laceration without foreign body of right index finger without damage to nail, initial encounter: Secondary | ICD-10-CM | POA: Diagnosis not present

## 2024-09-27 DIAGNOSIS — W268XXA Contact with other sharp object(s), not elsewhere classified, initial encounter: Secondary | ICD-10-CM | POA: Insufficient documentation

## 2024-09-27 DIAGNOSIS — M19049 Primary osteoarthritis, unspecified hand: Secondary | ICD-10-CM | POA: Diagnosis not present

## 2024-09-27 DIAGNOSIS — M7989 Other specified soft tissue disorders: Secondary | ICD-10-CM | POA: Diagnosis not present

## 2024-09-27 DIAGNOSIS — S6991XA Unspecified injury of right wrist, hand and finger(s), initial encounter: Secondary | ICD-10-CM | POA: Diagnosis not present

## 2024-09-27 MED ORDER — CEPHALEXIN 500 MG PO CAPS: 500.0000 mg | ORAL_CAPSULE | Freq: Three times a day (TID) | ORAL | 0 refills | Status: AC

## 2024-09-27 MED ORDER — LIDOCAINE HCL (PF) 1 % IJ SOLN
5.0000 mL | Freq: Once | INTRAMUSCULAR | Status: AC
  Administered 2024-09-27: 5 mL
  Filled 2024-09-27: qty 5

## 2024-09-27 NOTE — ED Notes (Addendum)
 Patient is being discharged from the Urgent Care and sent to the Emergency Department via pov . Per Teddy, M-PAC, patient is in need of higher level of care due to lacerations x 4 to his rt index figer. Patient is aware and verbalizes understanding of plan of care.  Vitals:   09/27/24 1215  BP: 138/84  Pulse: 68  Resp: 18  Temp: 98.2 F (36.8 C)  SpO2: 93%

## 2024-09-27 NOTE — ED Triage Notes (Signed)
 Pt presenting with  4 lacerations to his right index finger. Pt stated that he sustained same form toy airplane propellor. One laceration bleeding slightly controlled with dsg. Pt stated he is not on blood thinners. Last Tdap 2023.

## 2024-09-27 NOTE — Discharge Instructions (Addendum)
 Advised patient to go to [facility] ED now for further evaluation, laceration repair of right index finger.

## 2024-09-27 NOTE — ED Triage Notes (Signed)
 Pt sent from UC due to laceration to right finger,  States cut right index finger on propeller  Bleeding controlled, pressure dressing on at this time

## 2024-09-27 NOTE — Discharge Instructions (Addendum)
 You had a total of 9 stitches placed in your posterior aspect of the right index finger.  I have sent in an antibiotic into the pharmacy to keep it from getting infected.  If you notice any signs of infection such as worsening swelling, purulent drainage, or fever without any other source return for evaluation.

## 2024-09-27 NOTE — ED Provider Notes (Signed)
 [facility]    CSN: [id number] Arrival date & time: 09/27/24  1200      History   Chief Complaint Chief Complaint  Patient presents with   Laceration    HPI Larry Fuller is a 83 y.o. male.   HPI 83 year old male presents with multiple lacerations of right index finger.  Patient reports accidentally caught on hobbie toy airplane propeller blade while at home an hour ago.  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  Patient is accompanied by his wife today.  Past Medical History:  Diagnosis Date   Allergy 1956   took shots   Arthritis 2000   just a little not serious   Squamous cell carcinoma     There are no active problems to display for this patient.   Past Surgical History:  Procedure Laterality Date   INGUINAL HERNIA REPAIR  2023   mild fixed   JOINT REPLACEMENT  2017/2019   rt knee,rt shoulder   PROSTATE SURGERY     SHOULDER SURGERY Right    SQUAMOUS CELL CARCINOMA EXCISION         Home Medications    Prior to Admission medications   Medication Sig Start Date End Date Taking? Authorizing Provider  latanoprost (XALATAN) 0.005 % ophthalmic solution 1 drop at bedtime.    [provider]    Family History History reviewed. No pertinent family history.  Social History Social History   Tobacco Use   Smoking status: Never  Substance Use Topics   Alcohol use: Not Currently   Drug use: Not Currently     Allergies   Oxycodone and Opium   Review of Systems Review of Systems  Skin:  Positive for wound.  All other systems reviewed and are negative.    Physical Exam Triage Vital Signs ED Triage Vitals  Encounter Vitals Group     BP      Girls Systolic BP Percentile      Girls Diastolic BP Percentile      Boys Systolic BP Percentile      Boys Diastolic BP Percentile      Pulse      Resp      Temp      Temp src      SpO2      Weight      Height      Head Circumference      Peak Flow      Pain  Score      Pain Loc      Pain Education      Exclude from Growth Chart    No data found.  Updated Vital Signs BP 138/84 (BP Location: Left Arm)   Pulse 68   Temp 98.2 F (36.8 C) (Oral)   Resp 18   Ht 5' 8 (1.727 m)   Wt 152 lb (68.9 kg)   SpO2 93%   BMI 23.11 kg/m    Physical Exam Vitals and nursing note reviewed.  Constitutional:      Appearance: Normal appearance. He is normal weight. He is not ill-appearing.  HENT:     Head: Normocephalic and atraumatic.     Mouth/Throat:     Mouth: Mucous membranes are moist.     Pharynx: Oropharynx is clear.  Eyes:     Extraocular Movements: Extraocular movements intact.     Conjunctiva/sclera: Conjunctivae normal.     Pupils: Pupils are equal, round, and reactive to light.  Cardiovascular:  Rate and Rhythm: Normal rate and regular rhythm.     Heart sounds: Normal heart sounds.  Pulmonary:     Effort: Pulmonary effort is normal.     Breath sounds: Normal breath sounds. No wheezing, rhonchi or rales.  Musculoskeletal:        General: Normal range of motion.  Skin:    General: Skin is warm and dry.     Capillary Refill: Capillary refill takes less than 2 seconds.     Comments: Right index finger (dorsum): #4 linear lacerations noted-please see image below  Neurological:     General: No focal deficit present.     Mental Status: He is alert and oriented to person, place, and time. Mental status is at baseline.  Psychiatric:        Mood and Affect: Mood normal.        Behavior: Behavior normal.        Thought Content: Thought content normal.      UC Treatments / Results  Labs (all labs ordered are listed, but only abnormal results are displayed) Labs Reviewed - No data to display  EKG   Radiology No results found.  Procedures Procedures (including critical care time)  Medications Ordered in UC Medications - No data to display  Initial Impression / Assessment and Plan / UC Course  I have reviewed the  triage vital signs and the nursing notes.  Pertinent labs & imaging results that were available during my care of the patient were reviewed by me and considered in my medical decision making (see chart for details).     MDM: 1.  Laceration of right index finger with complication-Advised patient to go to [facility] ED now for further evaluation, laceration repair of right index finger.  Patient discharged home, hemodynamically stable. Final Clinical Impressions(s) / UC Diagnoses   Final diagnoses:  Laceration of right index finger with complication     Discharge Instructions      Advised patient to go to [facility] ED now for further evaluation, laceration repair of right index finger.     ED Prescriptions   None    PDMP not reviewed this encounter.   Teddy Sharper, FNP 09/27/24 1242

## 2024-09-27 NOTE — ED Provider Notes (Signed)
 [place] [facility] Provider Note   CSN: [id number] Arrival date & time: 09/27/24  1252     Patient presents with: Finger Laceration    Larry Fuller is a 83 y.o. male.   83 year old male presents with his wife for concern of laceration to dorsal aspect of the right finger.  He has several lacerations over this finger on the posterior aspect.  He is right-hand dominant.  He went to urgent care and was referred to the emergency department.  The history is provided by the patient. No language interpreter was used.       Prior to Admission medications   Medication Sig Start Date End Date Taking? Authorizing Provider  latanoprost (XALATAN) 0.005 % ophthalmic solution 1 drop at bedtime.    [provider]    Allergies: Oxycodone and Opium    Review of Systems  Constitutional:  Negative for fever.  Skin:  Positive for wound.    Updated Vital Signs BP 108/80 (BP Location: Right Arm)   Pulse 69   Temp 98.2 F (36.8 C) (Oral)   Resp 16   Ht 5' 8 (1.727 m)   Wt 68.9 kg   SpO2 95%   BMI 23.10 kg/m   Physical Exam Vitals and nursing note reviewed.  Constitutional:      General: He is not in acute distress.    Appearance: Normal appearance. He is not ill-appearing.  HENT:     Head: Normocephalic and atraumatic.     Nose: Nose normal.  Eyes:     Conjunctiva/sclera: Conjunctivae normal.  Pulmonary:     Effort: Pulmonary effort is normal. No respiratory distress.  Musculoskeletal:        General: No deformity.     Comments: See attached picture for description.  For superficial lacerations to the posterior aspect of the right index finger.  Neurovascularly intact.  No active bleeding.  Skin:    Findings: No rash.  Neurological:     Mental Status: He is alert.     (all labs ordered are listed, but only abnormal results are displayed) Labs Reviewed - No data to display  EKG: None  Radiology: DG Finger Index  Right Result Date: 09/27/2024 EXAM: 3 VIEW(S) XRAY OF THE FINGER(S) 09/27/2024 01:17:45 PM COMPARISON: None available. CLINICAL HISTORY: laceration with propeller FINDINGS: BONES AND JOINTS: No acute fracture. No joint dislocation. Moderate joint space narrowing and osteophyte formation of the distal interphalangeal joint. SOFT TISSUES: Soft tissue swelling. IMPRESSION: 1. Soft tissue swelling, likely related to the laceration. 2. 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 Electronically signed by: Oneil Devonshire MD 09/27/2024 01:26 PM EST RP Workstation: [id number]     .SABRALac repair Abdiel Blackerby  Date/Time: 09/27/2024 4:21 PM  Performed by: Hildegard Loge, PA-C Authorized by: Hildegard Loge, PA-C   Consent:    Consent obtained:  Verbal   Consent given by:  Patient   Risks discussed:  Need for additional repair, infection, retained foreign body, poor cosmetic result and poor wound healing   Alternatives discussed:  No treatment Universal protocol:    Procedure explained and questions answered to patient or proxy's satisfaction: yes     Relevant documents present and verified: yes     Patient identity confirmed:  Verbally with patient and arm band Anesthesia:    Anesthesia method:  Local infiltration   Local anesthetic:  Lidocaine  1% w/o epi Laceration details:    Location:  Finger  Finger location:  R index finger   Length (cm):  6 Pre-procedure details:    Preparation:  Patient was prepped and draped in usual sterile fashion Exploration:    Imaging obtained: x-ray     Imaging outcome: foreign body not noted   Treatment:    Area cleansed with:  Saline and povidone-iodine   Amount of cleaning:  Extensive   Irrigation solution:  Sterile saline   Irrigation volume:  500   Irrigation method:  Tap   Debridement:  None   Undermining:  None Skin repair:    Repair method:  Sutures   Suture size:  4-0   Suture material:  Prolene    Suture technique:  Simple interrupted   Number of sutures:  9 Approximation:    Approximation:  Close Repair type:    Repair type:  Simple Post-procedure details:    Dressing:  Non-adherent dressing   Procedure completion:  Tolerated well, no immediate complications Comments:     3 separate lacerations that required repair.  3 sutures placed in knees.  6 cm total between the 3 lacerations.    Medications Ordered in the ED  lidocaine  (PF) (XYLOCAINE ) 1 % injection 5 mL (has no administration in time range)                                    Medical Decision Making Amount and/or Complexity of Data Reviewed Radiology: ordered.  Risk Prescription drug management.   83 year old male presents today for concern of laceration to posterior aspect of the right index finger.  He has 4 stitches but 1 was fairly superficial at the most proximal 1 and did not require any stitches.  His tetanus shot was up-to-date and last given in 2023. The blade was also a plastic blade. Strict return precautions discussed .  See procedure note for repair. .  Keflex  prescribed.  Discharged in stable condition.  Return precaution discussed.  Final diagnoses:  Laceration of right index finger without foreign body without damage to nail, initial encounter    ED Discharge Orders          Ordered    cephALEXin  (KEFLEX ) 500 MG capsule  3 times daily        09/27/24 1612               Hildegard Loge, PA-C 09/27/24 1623    Patt Alm Macho, MD 09/28/24 [phone]

## 2024-09-27 NOTE — ED Notes (Signed)
 Cleansed wound with wound cleanser. Non-adherent dressing applied. Wrapped with gauze.

## 2024-09-28 ENCOUNTER — Encounter: Admitting: Medical

## 2024-10-08 ENCOUNTER — Ambulatory Visit (INDEPENDENT_AMBULATORY_CARE_PROVIDER_SITE_OTHER): Admitting: Medical

## 2024-10-08 VITALS — BP 120/72 | HR 44 | Temp 97.6°F | Resp 16 | Ht 68.0 in | Wt 158.0 lb

## 2024-10-08 DIAGNOSIS — E785 Hyperlipidemia, unspecified: Secondary | ICD-10-CM | POA: Diagnosis not present

## 2024-10-08 DIAGNOSIS — Z4802 Encounter for removal of sutures: Secondary | ICD-10-CM | POA: Diagnosis not present

## 2024-10-08 NOTE — Patient Instructions (Signed)
 Right index finger lacerations, post-suture removal Three lacerations on the right index finger with nine sutures removed. Proximal laceration healing but not fully healed.  - Applied three steri-strips to proximal laceration. - Buddy taped index finger to third finger for three days to prevent flexion and aid healing.  Hyperlipidemia Borderline cholesterol levels. He prefers lifestyle modifications over medication. Discussed low-dose cholesterol medication option, declined unless necessary. - Continue lifestyle modifications including healthy diet and avoiding alcohol.  Expect residual healing to be complete in 3 more days. -Follow up early summer for annual recheck typically about 6 months apart from [place] visits.

## 2024-10-08 NOTE — Progress Notes (Signed)
   Subjective:    Patient ID: Karis Emig, male    DOB: [birth year], 83 y.o.   MRN: [id number]  HPI   Phenix Vandermeulen is an 83 year old male who presents with lacerations on the right index finger.  He sustained three lacerations to the right index finger while launching a radio-controlled plane when his finger struck the propeller. The wounds required suturing thru the ED on 09-27-2024.   He completed an 11-day course of Keflex  three times daily.  Reviewed pt ED visit note.   High cholesterol and reviewed last labs done and discussed his desire just to control with diet.  Review of Systems See hpi    Objective:   Physical Exam  General- alert and oriented. EXTREMITIES: Right index finger with three lacerations, nine sutures removed, stereostrips applied, buddy taped to third finger. Well healing lacerations. Only most proximal mid portion looks like edges not fully apposed.        Assessment & Plan:   Right index finger lacerations, post-suture removal Three lacerations on the right index finger with nine sutures removed. Proximal laceration healing but not fully healed.  - Applied three steri-strips to proximal laceration. - Buddy taped index finger to third finger for three days to prevent flexion and aid healing.  Hyperlipidemia Borderline cholesterol levels. He prefers lifestyle modifications over medication. Discussed low-dose cholesterol medication option, declined unless necessary. - Continue lifestyle modifications including healthy diet and avoiding alcohol.  Expect residual healing to be complete in 3 more days. -Follow up early summer for annual recheck typically about 6 months apart from [place] visits.  Jehan Ranganathan, PA-C

## 2024-10-24 DIAGNOSIS — Z872 Personal history of diseases of the skin and subcutaneous tissue: Secondary | ICD-10-CM | POA: Diagnosis not present

## 2025-07-30 ENCOUNTER — Ambulatory Visit
